# Patient Record
Sex: Female | Born: 1955 | Race: White | Hispanic: No | Marital: Married | Smoking: Never smoker
Health system: Southern US, Community
[De-identification: ages and names within clinical notes are randomized; demographics above are authoritative.]

## PROBLEM LIST (undated history)

## (undated) DIAGNOSIS — J45909 Unspecified asthma, uncomplicated: Secondary | ICD-10-CM

## (undated) DIAGNOSIS — K219 Gastro-esophageal reflux disease without esophagitis: Secondary | ICD-10-CM

## (undated) DIAGNOSIS — G4700 Insomnia, unspecified: Secondary | ICD-10-CM

## (undated) DIAGNOSIS — L509 Urticaria, unspecified: Secondary | ICD-10-CM

## (undated) DIAGNOSIS — M1990 Unspecified osteoarthritis, unspecified site: Secondary | ICD-10-CM

## (undated) DIAGNOSIS — R32 Unspecified urinary incontinence: Secondary | ICD-10-CM

## (undated) DIAGNOSIS — I10 Essential (primary) hypertension: Secondary | ICD-10-CM

## (undated) HISTORY — DX: Unspecified osteoarthritis, unspecified site: M19.90

## (undated) HISTORY — PX: OTHER SURGICAL HISTORY: SHX169

## (undated) HISTORY — PX: DIAGNOSTIC LAPAROSCOPY: SUR761

## (undated) HISTORY — DX: Essential (primary) hypertension: I10

## (undated) HISTORY — DX: Unspecified asthma, uncomplicated: J45.909

## (undated) HISTORY — DX: Insomnia, unspecified: G47.00

## (undated) HISTORY — DX: Unspecified urinary incontinence: R32

## (undated) HISTORY — DX: Urticaria, unspecified: L50.9

---

## 1993-06-26 DIAGNOSIS — G4700 Insomnia, unspecified: Secondary | ICD-10-CM

## 1993-06-26 HISTORY — DX: Insomnia, unspecified: G47.00

## 2002-09-02 ENCOUNTER — Ambulatory Visit (HOSPITAL_COMMUNITY): Admission: RE | Admit: 2002-09-02 | Discharge: 2002-09-02 | Payer: Self-pay | Admitting: Family Medicine

## 2002-09-02 ENCOUNTER — Encounter: Payer: Self-pay | Admitting: Family Medicine

## 2002-10-16 ENCOUNTER — Encounter: Admission: RE | Admit: 2002-10-16 | Discharge: 2002-10-16 | Payer: Self-pay | Admitting: Family Medicine

## 2002-10-16 ENCOUNTER — Encounter: Payer: Self-pay | Admitting: Family Medicine

## 2003-12-24 ENCOUNTER — Other Ambulatory Visit: Admission: RE | Admit: 2003-12-24 | Discharge: 2003-12-24 | Payer: Self-pay | Admitting: Family Medicine

## 2004-12-23 ENCOUNTER — Ambulatory Visit (HOSPITAL_COMMUNITY): Admission: RE | Admit: 2004-12-23 | Discharge: 2004-12-23 | Payer: Self-pay | Admitting: Family Medicine

## 2004-12-26 ENCOUNTER — Other Ambulatory Visit: Admission: RE | Admit: 2004-12-26 | Discharge: 2004-12-26 | Payer: Self-pay | Admitting: Family Medicine

## 2006-01-16 ENCOUNTER — Other Ambulatory Visit: Admission: RE | Admit: 2006-01-16 | Discharge: 2006-01-16 | Payer: Self-pay | Admitting: Family Medicine

## 2006-01-26 ENCOUNTER — Ambulatory Visit (HOSPITAL_COMMUNITY): Admission: RE | Admit: 2006-01-26 | Discharge: 2006-01-26 | Payer: Self-pay | Admitting: Family Medicine

## 2007-01-21 ENCOUNTER — Other Ambulatory Visit: Admission: RE | Admit: 2007-01-21 | Discharge: 2007-01-21 | Payer: Self-pay | Admitting: Family Medicine

## 2007-01-30 ENCOUNTER — Ambulatory Visit (HOSPITAL_COMMUNITY): Admission: RE | Admit: 2007-01-30 | Discharge: 2007-01-30 | Payer: Self-pay | Admitting: Family Medicine

## 2007-11-28 ENCOUNTER — Encounter: Admission: RE | Admit: 2007-11-28 | Discharge: 2007-11-28 | Payer: Self-pay | Admitting: Family Medicine

## 2007-12-26 ENCOUNTER — Ambulatory Visit (HOSPITAL_COMMUNITY): Admission: RE | Admit: 2007-12-26 | Discharge: 2007-12-26 | Payer: Self-pay | Admitting: Family Medicine

## 2008-01-09 ENCOUNTER — Ambulatory Visit (HOSPITAL_COMMUNITY): Admission: RE | Admit: 2008-01-09 | Discharge: 2008-01-09 | Payer: Self-pay | Admitting: Family Medicine

## 2008-01-30 ENCOUNTER — Other Ambulatory Visit: Admission: RE | Admit: 2008-01-30 | Discharge: 2008-01-30 | Payer: Self-pay | Admitting: Family Medicine

## 2008-12-29 ENCOUNTER — Ambulatory Visit (HOSPITAL_COMMUNITY): Admission: RE | Admit: 2008-12-29 | Discharge: 2008-12-29 | Payer: Self-pay | Admitting: Family Medicine

## 2009-02-05 ENCOUNTER — Other Ambulatory Visit: Admission: RE | Admit: 2009-02-05 | Discharge: 2009-02-05 | Payer: Self-pay | Admitting: Family Medicine

## 2009-12-31 ENCOUNTER — Ambulatory Visit (HOSPITAL_COMMUNITY): Admission: RE | Admit: 2009-12-31 | Discharge: 2009-12-31 | Payer: Self-pay | Admitting: Family Medicine

## 2010-03-25 ENCOUNTER — Other Ambulatory Visit: Admission: RE | Admit: 2010-03-25 | Discharge: 2010-03-25 | Payer: Self-pay | Admitting: Family Medicine

## 2010-05-18 ENCOUNTER — Ambulatory Visit (HOSPITAL_BASED_OUTPATIENT_CLINIC_OR_DEPARTMENT_OTHER): Admission: RE | Admit: 2010-05-18 | Discharge: 2010-05-18 | Payer: Self-pay | Admitting: Orthopedic Surgery

## 2010-09-06 LAB — POCT I-STAT 4, (NA,K, GLUC, HGB,HCT)
Glucose, Bld: 108 mg/dL — ABNORMAL HIGH (ref 70–99)
HCT: 43 % (ref 36.0–46.0)
Hemoglobin: 14.6 g/dL (ref 12.0–15.0)
Potassium: 4.3 mEq/L (ref 3.5–5.1)
Sodium: 141 mEq/L (ref 135–145)

## 2011-12-18 ENCOUNTER — Other Ambulatory Visit (HOSPITAL_COMMUNITY): Payer: Self-pay | Admitting: Family Medicine

## 2011-12-18 DIAGNOSIS — Z1231 Encounter for screening mammogram for malignant neoplasm of breast: Secondary | ICD-10-CM

## 2011-12-26 ENCOUNTER — Other Ambulatory Visit: Payer: Self-pay | Admitting: Family Medicine

## 2011-12-26 DIAGNOSIS — R413 Other amnesia: Secondary | ICD-10-CM

## 2012-01-09 ENCOUNTER — Ambulatory Visit (HOSPITAL_COMMUNITY)
Admission: RE | Admit: 2012-01-09 | Discharge: 2012-01-09 | Disposition: A | Discharge status: Home / Self Care | Payer: BC Managed Care – PPO | Source: Ambulatory Visit | Attending: Family Medicine | Admitting: Family Medicine

## 2012-01-09 DIAGNOSIS — Z1231 Encounter for screening mammogram for malignant neoplasm of breast: Secondary | ICD-10-CM | POA: Insufficient documentation

## 2012-07-16 ENCOUNTER — Encounter: Payer: Self-pay | Admitting: Obstetrics and Gynecology

## 2012-07-16 ENCOUNTER — Encounter: Payer: BC Managed Care – PPO | Admitting: Obstetrics and Gynecology

## 2012-07-16 ENCOUNTER — Ambulatory Visit: Payer: BC Managed Care – PPO

## 2012-07-16 ENCOUNTER — Ambulatory Visit: Payer: BC Managed Care – PPO | Admitting: Obstetrics and Gynecology

## 2012-07-16 VITALS — BP 110/70 | Ht 62.0 in | Wt 154.0 lb

## 2012-07-16 DIAGNOSIS — D219 Benign neoplasm of connective and other soft tissue, unspecified: Secondary | ICD-10-CM

## 2012-07-16 DIAGNOSIS — N819 Female genital prolapse, unspecified: Secondary | ICD-10-CM

## 2012-07-16 DIAGNOSIS — N898 Other specified noninflammatory disorders of vagina: Secondary | ICD-10-CM

## 2012-07-16 DIAGNOSIS — N952 Postmenopausal atrophic vaginitis: Secondary | ICD-10-CM

## 2012-07-16 DIAGNOSIS — B373 Candidiasis of vulva and vagina: Secondary | ICD-10-CM

## 2012-07-16 DIAGNOSIS — R32 Unspecified urinary incontinence: Secondary | ICD-10-CM

## 2012-07-16 LAB — POCT URINALYSIS DIPSTICK
Bilirubin, UA: NEGATIVE
Blood, UA: NEGATIVE
Glucose, UA: NEGATIVE
Ketones, UA: NEGATIVE
Nitrite, UA: NEGATIVE
Protein, UA: NEGATIVE
Spec Grav, UA: 1.01
Urobilinogen, UA: NEGATIVE
pH, UA: 6

## 2012-07-16 LAB — POCT WET PREP (WET MOUNT)
Whiff Test: NEGATIVE
pH: 4.5

## 2012-07-16 MED ORDER — FLUCONAZOLE 150 MG PO TABS: 150.0000 mg | ORAL_TABLET | Freq: Once | ORAL | Status: DC

## 2012-07-16 NOTE — Progress Notes (Signed)
56 YO G 7 P 3-0-4-3,  referred by Dr. Docia Chuck for management of pelvic prolapse symptoms.  For the past month patient reports urine dribbling episodes on a daily basis-without warning. She has also felt what she thought to be her bladder at her vaginal opening.  She has noticed that it "retreats" at bedtime but is very prevalent during the day.  In the past she was told she had a 9 cm fibroid but that it decreased after childbirth.  Denies nocturia, dysuria, hematuria or change in bowel function.  Admits to a moderate vaginal discharge that is white and sometimes irritating.  O: Neck: supple no masses or thyromegaly/adenopathy      Heart:  RRR       Lungs: clear to auscultation      Abdomen: soft without tenderness      Pelvic:  EGBUS-atrophic with vaginal mucosa visible at vaginal opening, vagina anterior-prolapsed 4/4 cystocele, cervix/uterus-mild     descensus with uterus appearing 8-10 weeks size without tenderness, adnexae-no masses or tenderness  U/A- pH-6.0,  SG-1.010, leukocytes 1+ urine for culture  Wet Prep:  ph-4.5, whiff-negative, few yeast  A: Pelvic prolapse     Urinary Incontinence     Yeast Vaginitis      H/O Fibroid      Vulvovaginal Atrophy  P:  Reviewed medical and surgical management options for pelvic prolapse        Diflucan 150 mg #1  1 po stat         Pelvic U/S to evaluate fibroid with F/U with Dr. Su Hilt ASAP          RTO-as scheduled  POWELL,ELMIRA, PA-C

## 2012-07-22 ENCOUNTER — Encounter: Payer: Self-pay | Admitting: Obstetrics and Gynecology

## 2012-07-23 ENCOUNTER — Encounter: Payer: BC Managed Care – PPO | Admitting: Obstetrics and Gynecology

## 2012-07-23 ENCOUNTER — Telehealth: Payer: Self-pay | Admitting: Obstetrics and Gynecology

## 2012-07-23 MED ORDER — TERCONAZOLE 0.4 % VA CREA: TOPICAL_CREAM | VAGINAL | Status: DC

## 2012-07-23 NOTE — Telephone Encounter (Signed)
Patient with bladder prolapse and recent yeast diagnosis may be given Terazol 7 Vaginal Cream #1 tube to use 1 appl. pv qhs x 7 days 1 refill. POWELL,ELMIRA, PA-C

## 2012-07-23 NOTE — Telephone Encounter (Signed)
EP

## 2012-07-23 NOTE — Telephone Encounter (Signed)
Tc to pt per EP recs. Pt voices understanding. Rx for [employer] 7 e-pres to pharm on file.

## 2012-07-23 NOTE — Telephone Encounter (Signed)
Lm on vm to cb per telephone call.  

## 2012-07-23 NOTE — Telephone Encounter (Signed)
ELMIRA,       CHANDRA SENT YOU A MESSAGE ON THIS PT . PLEASE READ AND ADVISE.

## 2012-08-01 ENCOUNTER — Telehealth: Payer: Self-pay | Admitting: Obstetrics and Gynecology

## 2012-08-01 MED ORDER — TERCONAZOLE 0.4 % VA CREA: 1.0000 | TOPICAL_CREAM | Freq: Every day | VAGINAL | Status: DC

## 2012-08-01 NOTE — Telephone Encounter (Signed)
S: TC to patient return, she voiced c/o of discharge.  She completed Diflucan as directed 2 weeks ago had relief briefly.  Symptoms returned, so       she completed the Terozole Cream on Monday, had temporary relief, but symptoms (wet and thick white creamy discharge, burning) returned       on Tuesday.  Denies hx of DM, no other changes in soaps/detergents.    A: Patient with continued yeast symptoms  P: Discussed with EP, PA-C and encouraged patient to try baking soda sitz bath 3-4 days if no improvements will give patient 1 refill on Terozole          Cream.  Instruction give to patient and pt voiced understanding.   Lynnell Jude, FNP-BC

## 2012-08-07 ENCOUNTER — Ambulatory Visit: Payer: BC Managed Care – PPO

## 2012-08-07 ENCOUNTER — Encounter: Payer: Self-pay | Admitting: Obstetrics and Gynecology

## 2012-08-07 ENCOUNTER — Ambulatory Visit: Payer: BC Managed Care – PPO | Admitting: Obstetrics and Gynecology

## 2012-08-07 ENCOUNTER — Other Ambulatory Visit: Payer: Self-pay | Admitting: Obstetrics and Gynecology

## 2012-08-07 VITALS — BP 140/70 | HR 80 | Resp 16 | Wt 155.0 lb

## 2012-08-07 DIAGNOSIS — D219 Benign neoplasm of connective and other soft tissue, unspecified: Secondary | ICD-10-CM

## 2012-08-07 DIAGNOSIS — N8189 Other female genital prolapse: Secondary | ICD-10-CM

## 2012-08-07 DIAGNOSIS — R32 Unspecified urinary incontinence: Secondary | ICD-10-CM | POA: Insufficient documentation

## 2012-08-07 NOTE — Progress Notes (Signed)
Here to f/u u/s secondary to h/o fibroids and c/o dribbling during the day but sleeps through night 8hrs at a time.  She has not had a cycle for 60yrs.  Has to tighten legs when coughing to avoid leaking.  She had vag delivery.  Filed Vitals:   08/07/12 1444  BP: 140/70  Pulse: 80  Resp: 16   ROS: noncontributory  Pelvic exam:  VULVA: normal appearing vulva with no masses, tenderness or lesions,  VAGINA: normal appearing vagina with normal color and discharge, no lesions, significant cystocele down to introitus with pushing, gradw 1-2 rectocele CERVIX: normal appearing cervix without discharge or lesions,  UTERUS: uterus is normal size, shape, consistency and nontender,  ADNEXA: normal adnexa in size, nontender and no masses. RECTAL: no masses  U/S - Ut  7.0x8.8x6.5cm, rt ov not seen, lt ovary wnl 1.7, EE 0.325cm, 4 fibroids (1.5.8cm, 2.1.8, 3.1.9, 4.2.7)  A/P Asymptomatic fibroids but pt wants them removed so is contemplating hysterectomy that she wants in April If has hysterectomy I also rec BSO (pt has h/o salpingectomy for ectopic) Pt def want A-P repair and considering TVT but is apprehensive about bladder testing and mesh Plan TLH, BSO (h/o unilateral salpingectomy), cystoscopy, A-P repair, poss TVT - (pt need bladder testing at her convenience prior to surgery) - pt will let Adrianne know what she does and does not want for sure UCx sent Pamphlets hysterectomy and TVT.  Pt has incontinence pamphlet already.

## 2012-08-09 ENCOUNTER — Other Ambulatory Visit: Payer: Self-pay

## 2012-08-09 DIAGNOSIS — R32 Unspecified urinary incontinence: Secondary | ICD-10-CM

## 2012-08-12 ENCOUNTER — Encounter: Payer: Self-pay | Admitting: Obstetrics and Gynecology

## 2012-08-12 ENCOUNTER — Other Ambulatory Visit: Payer: BC Managed Care – PPO

## 2012-08-12 DIAGNOSIS — R32 Unspecified urinary incontinence: Secondary | ICD-10-CM

## 2012-08-15 ENCOUNTER — Telehealth: Payer: Self-pay | Admitting: Obstetrics and Gynecology

## 2012-08-15 LAB — URINE CULTURE: Colony Count: >=100000

## 2012-08-15 NOTE — Telephone Encounter (Signed)
Spoke with pt rgd msg informed results in awaiting ar to sign off on results pt voice understanding

## 2012-08-16 ENCOUNTER — Telehealth: Payer: Self-pay | Admitting: Obstetrics and Gynecology

## 2012-08-16 ENCOUNTER — Other Ambulatory Visit: Payer: Self-pay

## 2012-08-16 ENCOUNTER — Other Ambulatory Visit: Payer: Self-pay | Admitting: Obstetrics and Gynecology

## 2012-08-16 MED ORDER — NITROFURANTOIN MONOHYD MACRO 100 MG PO CAPS: 100.0000 mg | ORAL_CAPSULE | Freq: Two times a day (BID) | ORAL | Status: AC

## 2012-08-16 NOTE — Telephone Encounter (Signed)
TLH; Cystoscopy A&P Repair,Poss. TVT scheduled for 10/02/11 @ 7:30 with [facility]. [employer] effective 06/26/12. Plan pays 80/20 after a $700 deductible. Pre-op due $640.74 - 1/2 $320.37 -ap

## 2012-08-16 NOTE — Telephone Encounter (Signed)
Pt notified of UTI. Rx for Macrobid e-RX'd to [facility], 1 bid x 7 days # 14 NO RF's. Melody Comas A

## 2012-08-16 NOTE — Telephone Encounter (Signed)
Ar pt 

## 2012-08-16 NOTE — Telephone Encounter (Signed)
Discussion with patient when scheduling her surgery indicated she did not want to have her ovaries out right now.  She has additional questions about the need for hormone therapy if she does.  Please discuss this during her pre-op 09/09/12. -ap

## 2012-08-23 ENCOUNTER — Other Ambulatory Visit: Payer: Self-pay

## 2012-08-23 ENCOUNTER — Ambulatory Visit: Payer: BC Managed Care – PPO | Admitting: Obstetrics and Gynecology

## 2012-08-23 ENCOUNTER — Encounter: Payer: Self-pay | Admitting: Obstetrics and Gynecology

## 2012-08-23 VITALS — BP 146/72 | Resp 16 | Wt 154.0 lb

## 2012-08-23 DIAGNOSIS — N390 Urinary tract infection, site not specified: Secondary | ICD-10-CM

## 2012-08-23 DIAGNOSIS — R32 Unspecified urinary incontinence: Secondary | ICD-10-CM

## 2012-08-23 LAB — POCT URINALYSIS DIPSTICK
Bilirubin, UA: NEGATIVE
Glucose, UA: NEGATIVE
Nitrite, UA: POSITIVE
Protein, UA: NEGATIVE
Spec Grav, UA: <=1.005
Urobilinogen, UA: NEGATIVE
pH, UA: 7

## 2012-08-23 LAB — POCT WET PREP (WET MOUNT)
Clue Cells Wet Prep Whiff POC: NEGATIVE
pH: 5

## 2012-08-23 NOTE — Progress Notes (Signed)
Here for Lumax and still c/o irritation "down there"  Filed Vitals:   08/23/12 1443  BP: 146/72  Resp: 16   ROS: noncontributory  Pelvic exam:  VULVA: normal appearing vulva with no masses, tenderness or lesions,  VAGINA: normal appearing vagina with normal color and discharge, no lesions, CERVIX: normal appearing cervix without discharge or lesions,  UTERUS: uterus is normal size, shape, consistency and nontender,  ADNEXA: normal adnexa in size, nontender and no masses.  A/P Wet prep - neg with elevated ph rec trial of rephresh Urine to Cx - if neg r/s lumax and if positive then treat and r/s lumax to after course of tx

## 2012-08-27 LAB — URINE CULTURE: Colony Count: 50000

## 2012-08-28 ENCOUNTER — Telehealth: Payer: Self-pay | Admitting: Obstetrics and Gynecology

## 2012-08-28 DIAGNOSIS — N390 Urinary tract infection, site not specified: Secondary | ICD-10-CM

## 2012-08-28 MED ORDER — LEVOFLOXACIN 250 MG PO TABS: ORAL_TABLET | ORAL | Status: DC

## 2012-08-28 NOTE — Telephone Encounter (Signed)
Megan [name] to address

## 2012-08-28 NOTE — Telephone Encounter (Signed)
Pt notified of UCX result. Per AR Levaquin 250 mg  1 po qd x 3 days # 3  NO RF's e-Rx'd to pt's pharmacy. TOC appt set for 09/06/2012. Pt needs to be cleared of infection prior to Lumax testing.  Melody Comas A

## 2012-09-06 ENCOUNTER — Other Ambulatory Visit: Payer: BC Managed Care – PPO

## 2012-09-06 DIAGNOSIS — N390 Urinary tract infection, site not specified: Secondary | ICD-10-CM

## 2012-09-08 LAB — URINE CULTURE
Colony Count: NO GROWTH
Organism ID, Bacteria: NO GROWTH

## 2012-09-09 ENCOUNTER — Encounter: Payer: BC Managed Care – PPO | Admitting: Obstetrics and Gynecology

## 2012-09-13 ENCOUNTER — Other Ambulatory Visit (HOSPITAL_COMMUNITY): Payer: Self-pay | Admitting: Obstetrics and Gynecology

## 2012-09-13 NOTE — H&P (Signed)
Megan [name] is a 57 y.o.  married female G7 P 3-0-4-3  presents for hysterectomy with anterior and posterior repair because of symptomatic uterine prolapse. The patient was referred by Dr. Docia Chuck with Megan [name] at [facility] a year ago for evaluation and management of uterine prolapse. At that time patient complained of dribbling urine involuntarily, having to cross her legs to cough to prevent urine leakage and copious amounts of white vaginal discharge that was otherwise asymptomatic. Over the course of the year the patient noticed that she could see and feel her bladder or cervix protruding from her vagina( though when she reclined it would "retreat"). She denies dysuria, hematuria, urinary frequency, dyspareunia , bowel changes, vaginal itching or odor.   A pelvic ultrasound in February of this year showed: uterus 8.81 x 6.56 x 7.06 cm with left ovary: 1.72 x 1.28 x 1.21 cm-her right ovary was not visible. She also was found to have #4 measurable fibroids: posterior subserosal with a calcified rim-5.2 x 5.1 x 5.8 cm, #2 left anterior intramural fibroids: 1.8 x 1.7 x 1.9 cm & 1.6 x 1.7 x 1.9 cm and a right fundal intramural fibroid: 2.7 x 2.6 x 2.5 cm A review of both medical and surgical management options were given to the patient however, she chooses to proceed with hysterectomy with anterior/posterior repair.   Past Medical History  OB History: G 7;  P 3-0-4-3; . SVD- 1989, 1991 and 1992  GYN History: menarche: 57 YO; LMP: menopausal; Contracepton menopausal  The patient denies history of sexually transmitted disease.  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   ROS: Admits to glasses, right hand paresthesias, bilateral hand arthritis, occasional indigestion/acid reflux, shortness of breath with asthma;  Denies headache, vision changes, nasal congestion, dysphagia, tinnitus, dizziness, hoarseness, cough,  chest pain, nausea, vomiting, diarrhea,constipation,  urinary frequency, dysuria, hematuria, vaginitis symptoms, pelvic pain, swelling of joints,easy bruising,  myalgias, skin rashes (except with urticaria flare), unexplained weight loss and except as is mentioned in the history of present illness, patient's review of systems is otherwise negative.  Physical Exam  BP 140/70, P 80 regular  Weight 146lbs  Height  5'2"  BMI  26.7  Temperature 98.8 degrees F (orally) Neck: supple without masses or thyromegaly Lungs: clear to auscultation Heart: regular rate and rhythm Abdomen: soft, non-tender and no organomegaly Pelvic:EGBUS-atrophic vagina-atrophic with cervix visible just inside of vaginal opening; uterus-8-10 week size with moderate descent, cervix without lesions or motion tenderness; adnexae-no tenderness or masses Extremities:  no clubbing, cyanosis or edema   Assesment:  Pelvic Prolapse                       Urinary Incontinence  Uterine Fibroids   Disposition:  A discussion was held with patient regarding the indication for her procedure(s) along with the risks, which include but are not limited to: reaction to anesthesia, damage to adjacent organs, infection, worsening  bladder symptoms, erosion of tension free mesh (if TVT performed)  and excessive bleeding. A Miralax Bowel Prep was given to the patient to be completed 24 hours before procedure.  The patient verbalized understanding of the risks and pre-operative instructions and has consented to proceed with a Total Laparoscopic Hysterectomy, Cystoscopy, Anterior/Posterior Repair, Bilateral Salpingectomy and possible Placement of Tension Free Vaginal Tape at [facility], October 02, 2011 at 7:30 a.m.   CSN# [id number]   Megan J. Lowell Guitar, PA-C  for Dr. Woodroe Mode. Su Hilt

## 2012-09-19 ENCOUNTER — Encounter (HOSPITAL_COMMUNITY): Payer: Self-pay | Admitting: Pharmacist

## 2012-09-23 ENCOUNTER — Encounter (HOSPITAL_COMMUNITY): Payer: Self-pay

## 2012-09-23 ENCOUNTER — Encounter (HOSPITAL_COMMUNITY)
Admission: RE | Admit: 2012-09-23 | Discharge: 2012-09-23 | Disposition: A | Discharge status: Home / Self Care | Payer: BC Managed Care – PPO | Source: Ambulatory Visit | Attending: Obstetrics and Gynecology | Admitting: Obstetrics and Gynecology

## 2012-09-23 DIAGNOSIS — Z01812 Encounter for preprocedural laboratory examination: Secondary | ICD-10-CM | POA: Insufficient documentation

## 2012-09-23 DIAGNOSIS — Z01818 Encounter for other preprocedural examination: Secondary | ICD-10-CM | POA: Insufficient documentation

## 2012-09-23 HISTORY — DX: Gastro-esophageal reflux disease without esophagitis: K21.9

## 2012-09-23 LAB — BASIC METABOLIC PANEL
BUN: 13 mg/dL (ref 6–23)
CO2: 29 mEq/L (ref 19–32)
Calcium: 9.9 mg/dL (ref 8.4–10.5)
Chloride: 99 mEq/L (ref 96–112)
Creatinine, Ser: 0.8 mg/dL (ref 0.50–1.10)
GFR calc Af Amer: >90 mL/min (ref >90)
GFR calc non Af Amer: 82 mL/min — ABNORMAL LOW (ref >90)
Glucose, Bld: 94 mg/dL (ref 70–99)
Potassium: 3.9 mEq/L (ref 3.5–5.1)
Sodium: 136 mEq/L (ref 135–145)

## 2012-09-23 LAB — CBC
HCT: 40.2 % (ref 36.0–46.0)
Hemoglobin: 13.7 g/dL (ref 12.0–15.0)
MCH: 29.5 pg (ref 26.0–34.0)
MCHC: 34.1 g/dL (ref 30.0–36.0)
MCV: 86.6 fL (ref 78.0–100.0)
Platelets: 337 10*3/uL (ref 150–400)
RBC: 4.64 MIL/uL (ref 3.87–5.11)
RDW: 12.6 % (ref 11.5–15.5)
WBC: 7.8 10*3/uL (ref 4.0–10.5)

## 2012-09-23 LAB — SURGICAL PCR SCREEN
MRSA, PCR: NEGATIVE
Staphylococcus aureus: POSITIVE — AB

## 2012-09-23 NOTE — Patient Instructions (Signed)
Your procedure is scheduled on:10/01/12  Enter through the Main Entrance at : 6am Pick up desk phone and dial [phone] and inform us of your arrival.  Please call [phone] if you have any problems the morning of surgery.  Remember: Do not eat or drink after midnight:Monday   Take these meds the morning of surgery with a sip of water:usual am meds  DO NOT wear jewelry, eye make-up, lipstick,body lotion, or dark fingernail polish. Do not shave for 48 hours prior to surgery.  If you are to be admitted after surgery, leave suitcase in car until your room has been assigned. Patients discharged on the day of surgery will not be allowed to drive home.

## 2012-09-24 NOTE — Pre-Procedure Instructions (Signed)
Copy of EKG sent via fax to Dr. Veverly Fells per pt's request.

## 2012-09-27 MED ORDER — OXYTOCIN 10 UNIT/ML IJ SOLN
INTRAMUSCULAR | Status: AC
  Filled 2012-09-27: qty 4

## 2012-09-27 MED ORDER — ONDANSETRON HCL 4 MG/2ML IJ SOLN
INTRAMUSCULAR | Status: AC
  Filled 2012-09-27: qty 2

## 2012-09-27 MED ORDER — SODIUM BICARBONATE 8.4 % IV SOLN
INTRAVENOUS | Status: AC
  Filled 2012-09-27: qty 50

## 2012-09-27 MED ORDER — MEPERIDINE HCL 25 MG/ML IJ SOLN
INTRAMUSCULAR | Status: AC
  Filled 2012-09-27: qty 1

## 2012-09-27 MED ORDER — LIDOCAINE-EPINEPHRINE (PF) 2 %-1:200000 IJ SOLN
INTRAMUSCULAR | Status: AC
  Filled 2012-09-27: qty 20

## 2012-09-27 MED ORDER — MORPHINE SULFATE 0.5 MG/ML IJ SOLN
INTRAMUSCULAR | Status: AC
  Filled 2012-09-27: qty 10

## 2012-10-16 ENCOUNTER — Other Ambulatory Visit: Payer: Self-pay | Admitting: Obstetrics and Gynecology

## 2012-10-28 ENCOUNTER — Encounter (HOSPITAL_COMMUNITY)
Admission: RE | Admit: 2012-10-28 | Discharge: 2012-10-28 | Disposition: A | Discharge status: Home / Self Care | Payer: BC Managed Care – PPO | Source: Ambulatory Visit | Attending: Obstetrics and Gynecology | Admitting: Obstetrics and Gynecology

## 2012-10-28 ENCOUNTER — Encounter (HOSPITAL_COMMUNITY): Payer: Self-pay

## 2012-10-28 DIAGNOSIS — Z01818 Encounter for other preprocedural examination: Secondary | ICD-10-CM | POA: Insufficient documentation

## 2012-10-28 DIAGNOSIS — Z01812 Encounter for preprocedural laboratory examination: Secondary | ICD-10-CM | POA: Insufficient documentation

## 2012-10-28 LAB — BASIC METABOLIC PANEL
BUN: 15 mg/dL (ref 6–23)
CO2: 30 mEq/L (ref 19–32)
Calcium: 9.6 mg/dL (ref 8.4–10.5)
Chloride: 99 mEq/L (ref 96–112)
Creatinine, Ser: 0.73 mg/dL (ref 0.50–1.10)
GFR calc Af Amer: >90 mL/min (ref >90)
GFR calc non Af Amer: >90 mL/min (ref >90)
Glucose, Bld: 148 mg/dL — ABNORMAL HIGH (ref 70–99)
Potassium: 3.6 mEq/L (ref 3.5–5.1)
Sodium: 138 mEq/L (ref 135–145)

## 2012-10-28 LAB — CBC
HCT: 38.9 % (ref 36.0–46.0)
Hemoglobin: 13.2 g/dL (ref 12.0–15.0)
MCH: 29.6 pg (ref 26.0–34.0)
MCHC: 33.9 g/dL (ref 30.0–36.0)
MCV: 87.2 fL (ref 78.0–100.0)
Platelets: 323 10*3/uL (ref 150–400)
RBC: 4.46 MIL/uL (ref 3.87–5.11)
RDW: 12.8 % (ref 11.5–15.5)
WBC: 6.4 10*3/uL (ref 4.0–10.5)

## 2012-10-28 LAB — SURGICAL PCR SCREEN
MRSA, PCR: NEGATIVE
Staphylococcus aureus: NEGATIVE

## 2012-10-28 NOTE — Patient Instructions (Addendum)
Your procedure is scheduled on:11/06/12  Enter through the Main Entrance at :1045 am Pick up desk phone and dial [phone] and inform us of your arrival.  Please call [phone] if you have any problems the morning of surgery.  Remember: Do not eat after midnight: Tuesday Clear liquids ok until 8am on WED   Take these meds the morning of surgery with a sip of water:blood pressure meds, allergy pill if needed  DO NOT wear jewelry, eye make-up, lipstick,body lotion, or dark fingernail polish.   If you are to be admitted after surgery, leave suitcase in car until your room has been assigned. Patients discharged on the day of surgery will not be allowed to drive home.

## 2012-11-05 MED ORDER — DEXTROSE 5 % IV SOLN
2.0000 g | INTRAVENOUS | Status: AC
  Administered 2012-11-06: 2 g via INTRAVENOUS
  Filled 2012-11-05: qty 2

## 2012-11-05 MED ORDER — DEXTROSE 5 % IV SOLN
2.0000 g | INTRAVENOUS | Status: DC
  Filled 2012-11-05: qty 2

## 2012-11-06 ENCOUNTER — Ambulatory Visit (HOSPITAL_COMMUNITY): Payer: BC Managed Care – PPO | Admitting: Anesthesiology

## 2012-11-06 ENCOUNTER — Encounter (HOSPITAL_COMMUNITY)
Admission: RE | Disposition: A | Discharge status: Home / Self Care | Payer: Self-pay | Source: Ambulatory Visit | Attending: Obstetrics and Gynecology

## 2012-11-06 ENCOUNTER — Encounter (HOSPITAL_COMMUNITY): Payer: Self-pay | Admitting: Anesthesiology

## 2012-11-06 ENCOUNTER — Ambulatory Visit (HOSPITAL_COMMUNITY)
Admission: RE | Admit: 2012-11-06 | Discharge: 2012-11-07 | Disposition: A | Discharge status: Home / Self Care | Payer: BC Managed Care – PPO | Source: Ambulatory Visit | Attending: Obstetrics and Gynecology | Admitting: Obstetrics and Gynecology

## 2012-11-06 DIAGNOSIS — N840 Polyp of corpus uteri: Secondary | ICD-10-CM | POA: Insufficient documentation

## 2012-11-06 DIAGNOSIS — N393 Stress incontinence (female) (male): Secondary | ICD-10-CM | POA: Insufficient documentation

## 2012-11-06 DIAGNOSIS — D219 Benign neoplasm of connective and other soft tissue, unspecified: Secondary | ICD-10-CM

## 2012-11-06 DIAGNOSIS — N812 Incomplete uterovaginal prolapse: Principal | ICD-10-CM | POA: Insufficient documentation

## 2012-11-06 DIAGNOSIS — D251 Intramural leiomyoma of uterus: Secondary | ICD-10-CM | POA: Insufficient documentation

## 2012-11-06 DIAGNOSIS — N815 Vaginal enterocele: Secondary | ICD-10-CM | POA: Insufficient documentation

## 2012-11-06 DIAGNOSIS — N8189 Other female genital prolapse: Secondary | ICD-10-CM

## 2012-11-06 DIAGNOSIS — R32 Unspecified urinary incontinence: Secondary | ICD-10-CM

## 2012-11-06 HISTORY — PX: CYSTOSCOPY: SHX5120

## 2012-11-06 HISTORY — PX: BLADDER SUSPENSION: SHX72

## 2012-11-06 HISTORY — PX: UNILATERAL SALPINGECTOMY: SHX6160

## 2012-11-06 HISTORY — PX: ANTERIOR AND POSTERIOR REPAIR: SHX5121

## 2012-11-06 HISTORY — PX: LAPAROSCOPIC HYSTERECTOMY: SHX1926

## 2012-11-06 SURGERY — HYSTERECTOMY, TOTAL, LAPAROSCOPIC
Anesthesia: General | Site: Bladder | Laterality: Right | Wound class: Clean Contaminated

## 2012-11-06 MED ORDER — METOPROLOL TARTRATE 50 MG PO TABS
50.0000 mg | ORAL_TABLET | Freq: Two times a day (BID) | ORAL | Status: DC
Start: 2012-11-06 — End: 2012-11-07
  Administered 2012-11-06 – 2012-11-07 (×2): 50 mg via ORAL
  Filled 2012-11-06 (×4): qty 1

## 2012-11-06 MED ORDER — HYDROMORPHONE HCL PF 1 MG/ML IJ SOLN
INTRAMUSCULAR | Status: AC
  Filled 2012-11-06: qty 1

## 2012-11-06 MED ORDER — MEPERIDINE HCL 25 MG/ML IJ SOLN: 6.2500 mg | INTRAMUSCULAR | Status: DC | PRN

## 2012-11-06 MED ORDER — LACTATED RINGERS IR SOLN
Status: DC | PRN
  Administered 2012-11-06: 3000 mL

## 2012-11-06 MED ORDER — PROPOFOL INFUSION 10 MG/ML OPTIME
INTRAVENOUS | Status: DC | PRN
  Administered 2012-11-06: 200 mL via INTRAVENOUS

## 2012-11-06 MED ORDER — LORATADINE 10 MG PO TABS
10.0000 mg | ORAL_TABLET | Freq: Every day | ORAL | Status: DC
  Filled 2012-11-06: qty 1

## 2012-11-06 MED ORDER — DIPHENHYDRAMINE HCL 50 MG/ML IJ SOLN: 12.5000 mg | Freq: Four times a day (QID) | INTRAMUSCULAR | Status: DC | PRN

## 2012-11-06 MED ORDER — LACTATED RINGERS IV SOLN
INTRAVENOUS | Status: DC
  Administered 2012-11-06 – 2012-11-07 (×2): via INTRAVENOUS

## 2012-11-06 MED ORDER — BUPIVACAINE HCL (PF) 0.25 % IJ SOLN
INTRAMUSCULAR | Status: AC
  Filled 2012-11-06: qty 30

## 2012-11-06 MED ORDER — SCOPOLAMINE 1 MG/3DAYS TD PT72: 1.0000 | MEDICATED_PATCH | TRANSDERMAL | Status: DC

## 2012-11-06 MED ORDER — DIPHENHYDRAMINE HCL 12.5 MG/5ML PO ELIX: 12.5000 mg | ORAL_SOLUTION | Freq: Four times a day (QID) | ORAL | Status: DC | PRN

## 2012-11-06 MED ORDER — ESTRADIOL 0.1 MG/GM VA CREA
TOPICAL_CREAM | VAGINAL | Status: DC | PRN
  Administered 2012-11-06: 1 via VAGINAL

## 2012-11-06 MED ORDER — OXYCODONE-ACETAMINOPHEN 5-325 MG PO TABS: 1.0000 | ORAL_TABLET | ORAL | Status: DC | PRN

## 2012-11-06 MED ORDER — SCOPOLAMINE 1 MG/3DAYS TD PT72
MEDICATED_PATCH | TRANSDERMAL | Status: AC
  Administered 2012-11-06: 1.5 mg via TRANSDERMAL
  Filled 2012-11-06: qty 1

## 2012-11-06 MED ORDER — BUPIVACAINE HCL (PF) 0.25 % IJ SOLN
INTRAMUSCULAR | Status: DC | PRN
  Administered 2012-11-06: 25 mL

## 2012-11-06 MED ORDER — ONDANSETRON HCL 4 MG/2ML IJ SOLN
INTRAMUSCULAR | Status: DC | PRN
  Administered 2012-11-06: 4 mg via INTRAVENOUS

## 2012-11-06 MED ORDER — FENTANYL CITRATE 0.05 MG/ML IJ SOLN
INTRAMUSCULAR | Status: DC | PRN
  Administered 2012-11-06: 150 ug via INTRAVENOUS
  Administered 2012-11-06: 100 ug via INTRAVENOUS

## 2012-11-06 MED ORDER — IBUPROFEN 600 MG PO TABS: 600.0000 mg | ORAL_TABLET | Freq: Four times a day (QID) | ORAL | Status: DC | PRN

## 2012-11-06 MED ORDER — HYDROMORPHONE HCL PF 1 MG/ML IJ SOLN
INTRAMUSCULAR | Status: DC | PRN
  Administered 2012-11-06: 1 mg via INTRAVENOUS

## 2012-11-06 MED ORDER — MIDAZOLAM HCL 2 MG/2ML IJ SOLN
INTRAMUSCULAR | Status: AC
  Filled 2012-11-06: qty 2

## 2012-11-06 MED ORDER — SODIUM CHLORIDE 0.9 % IJ SOLN: 9.0000 mL | INTRAMUSCULAR | Status: DC | PRN

## 2012-11-06 MED ORDER — METOPROLOL TARTRATE 25 MG PO TABS
25.0000 mg | ORAL_TABLET | Freq: Two times a day (BID) | ORAL | Status: DC
  Filled 2012-11-06 (×2): qty 1

## 2012-11-06 MED ORDER — KETOROLAC TROMETHAMINE 30 MG/ML IJ SOLN: 30.0000 mg | Freq: Four times a day (QID) | INTRAMUSCULAR | Status: DC

## 2012-11-06 MED ORDER — ROCURONIUM BROMIDE 100 MG/10ML IV SOLN
INTRAVENOUS | Status: DC | PRN
  Administered 2012-11-06: 50 mg via INTRAVENOUS
  Administered 2012-11-06: 20 mg via INTRAVENOUS
  Administered 2012-11-06: 10 mg via INTRAVENOUS

## 2012-11-06 MED ORDER — KETOROLAC TROMETHAMINE 30 MG/ML IJ SOLN
INTRAMUSCULAR | Status: DC | PRN
  Administered 2012-11-06: 30 mg via INTRAVENOUS

## 2012-11-06 MED ORDER — VASOPRESSIN 20 UNIT/ML IJ SOLN
INTRAVENOUS | Status: DC | PRN
  Administered 2012-11-06: 14:00:00 via INTRAMUSCULAR

## 2012-11-06 MED ORDER — INDIGOTINDISULFONATE SODIUM 8 MG/ML IJ SOLN
INTRAMUSCULAR | Status: AC
  Filled 2012-11-06: qty 5

## 2012-11-06 MED ORDER — LACTATED RINGERS IV SOLN
INTRAVENOUS | Status: DC
  Administered 2012-11-06 (×4): via INTRAVENOUS
  Administered 2012-11-06: 1000 mL via INTRAVENOUS

## 2012-11-06 MED ORDER — PHENYLEPHRINE 40 MCG/ML (10ML) SYRINGE FOR IV PUSH (FOR BLOOD PRESSURE SUPPORT)
PREFILLED_SYRINGE | INTRAVENOUS | Status: AC
  Filled 2012-11-06: qty 5

## 2012-11-06 MED ORDER — MIDAZOLAM HCL 5 MG/5ML IJ SOLN
INTRAMUSCULAR | Status: DC | PRN
  Administered 2012-11-06: 2 mg via INTRAVENOUS

## 2012-11-06 MED ORDER — ESTRADIOL 0.1 MG/GM VA CREA
TOPICAL_CREAM | VAGINAL | Status: AC
  Filled 2012-11-06: qty 42.5

## 2012-11-06 MED ORDER — KETOROLAC TROMETHAMINE 30 MG/ML IJ SOLN
INTRAMUSCULAR | Status: AC
  Filled 2012-11-06: qty 1

## 2012-11-06 MED ORDER — DEXAMETHASONE SODIUM PHOSPHATE 4 MG/ML IJ SOLN
INTRAMUSCULAR | Status: DC | PRN
  Administered 2012-11-06: 8 mg via INTRAVENOUS

## 2012-11-06 MED ORDER — HYDROMORPHONE HCL PF 1 MG/ML IJ SOLN: 0.2500 mg | INTRAMUSCULAR | Status: DC | PRN

## 2012-11-06 MED ORDER — VASOPRESSIN 20 UNIT/ML IJ SOLN
INTRAMUSCULAR | Status: AC
  Filled 2012-11-06: qty 1

## 2012-11-06 MED ORDER — GLYCOPYRROLATE 0.2 MG/ML IJ SOLN
INTRAMUSCULAR | Status: AC
  Filled 2012-11-06: qty 1

## 2012-11-06 MED ORDER — BUPIVACAINE HCL (PF) 0.25 % IJ SOLN
INTRAMUSCULAR | Status: AC
  Filled 2012-11-06: qty 60

## 2012-11-06 MED ORDER — LIDOCAINE HCL (CARDIAC) 20 MG/ML IV SOLN
INTRAVENOUS | Status: DC | PRN
  Administered 2012-11-06: 50 mg via INTRAVENOUS

## 2012-11-06 MED ORDER — ONDANSETRON HCL 4 MG/2ML IJ SOLN: 4.0000 mg | Freq: Four times a day (QID) | INTRAMUSCULAR | Status: DC | PRN

## 2012-11-06 MED ORDER — GLYCOPYRROLATE 0.2 MG/ML IJ SOLN
INTRAMUSCULAR | Status: DC | PRN
  Administered 2012-11-06 (×2): 0.2 mg via INTRAVENOUS

## 2012-11-06 MED ORDER — METOPROLOL TARTRATE 50 MG PO TABS
50.0000 mg | ORAL_TABLET | Freq: Two times a day (BID) | ORAL | Status: DC
  Filled 2012-11-06 (×2): qty 1

## 2012-11-06 MED ORDER — HYDROMORPHONE 0.3 MG/ML IV SOLN
INTRAVENOUS | Status: DC
  Administered 2012-11-06: 23:00:00 via INTRAVENOUS
  Administered 2012-11-07: 1.33 mg via INTRAVENOUS
  Administered 2012-11-07: 0.2 mg via INTRAVENOUS
  Filled 2012-11-06: qty 25

## 2012-11-06 MED ORDER — PHENYLEPHRINE HCL 10 MG/ML IJ SOLN
INTRAMUSCULAR | Status: DC | PRN
  Administered 2012-11-06: .04 mg via INTRAVENOUS
  Administered 2012-11-06 (×2): .08 mg via INTRAVENOUS

## 2012-11-06 MED ORDER — AMITRIPTYLINE HCL 25 MG PO TABS
150.0000 mg | ORAL_TABLET | Freq: Every evening | ORAL | Status: DC | PRN
  Administered 2012-11-07: 150 mg via ORAL
  Filled 2012-11-06: qty 6

## 2012-11-06 MED ORDER — FAMOTIDINE 20 MG PO TABS: 20.0000 mg | ORAL_TABLET | Freq: Every day | ORAL | Status: DC | PRN

## 2012-11-06 MED ORDER — METOCLOPRAMIDE HCL 5 MG/ML IJ SOLN: 10.0000 mg | Freq: Once | INTRAMUSCULAR | Status: DC | PRN

## 2012-11-06 MED ORDER — LEVOCETIRIZINE DIHYDROCHLORIDE 5 MG PO TABS: 5.0000 mg | ORAL_TABLET | Freq: Every evening | ORAL | Status: DC

## 2012-11-06 MED ORDER — NALOXONE HCL 0.4 MG/ML IJ SOLN: 0.4000 mg | INTRAMUSCULAR | Status: DC | PRN

## 2012-11-06 MED ORDER — NEOSTIGMINE METHYLSULFATE 1 MG/ML IJ SOLN
INTRAMUSCULAR | Status: AC
  Filled 2012-11-06: qty 1

## 2012-11-06 MED ORDER — INDIGOTINDISULFONATE SODIUM 8 MG/ML IJ SOLN
INTRAMUSCULAR | Status: DC | PRN
  Administered 2012-11-06 (×2): 5 mL via INTRAVENOUS

## 2012-11-06 MED ORDER — FENTANYL CITRATE 0.05 MG/ML IJ SOLN
INTRAMUSCULAR | Status: AC
  Filled 2012-11-06: qty 5

## 2012-11-06 MED ORDER — NEOSTIGMINE METHYLSULFATE 1 MG/ML IJ SOLN
INTRAMUSCULAR | Status: DC | PRN
  Administered 2012-11-06: 1 mg via INTRAVENOUS

## 2012-11-06 SURGICAL SUPPLY — 74 items
BLADE SURG 10 STRL SS (BLADE) ×5 IMPLANT
BLADE SURG 11 STRL SS (BLADE) ×5 IMPLANT
BLADE SURG 15 STRL LF C SS BP (BLADE) ×4 IMPLANT
BLADE SURG 15 STRL SS (BLADE) ×1
BLADE SURG CLIPPER 3M 9600 (MISCELLANEOUS) ×5 IMPLANT
CANISTER SUCTION 2500CC (MISCELLANEOUS) ×10 IMPLANT
CATH FOLEY 2WAY SLVR  5CC 18FR (CATHETERS) ×3
CATH FOLEY 2WAY SLVR 5CC 18FR (CATHETERS) ×12 IMPLANT
CHLORAPREP W/TINT 26ML (MISCELLANEOUS) ×5 IMPLANT
CLOTH BEACON ORANGE TIMEOUT ST (SAFETY) ×5 IMPLANT
CONT PATH 16OZ SNAP LID 3702 (MISCELLANEOUS) ×5 IMPLANT
COVER MAYO STAND STRL (DRAPES) ×5 IMPLANT
DECANTER SPIKE VIAL GLASS SM (MISCELLANEOUS) ×10 IMPLANT
DERMABOND ADVANCED (GAUZE/BANDAGES/DRESSINGS) ×2
DERMABOND ADVANCED .7 DNX12 (GAUZE/BANDAGES/DRESSINGS) ×8 IMPLANT
DRAPE HYSTEROSCOPY (DRAPE) ×5 IMPLANT
DRAPE PROXIMA HALF (DRAPES) ×5 IMPLANT
DRAPE STERI URO 9X17 APER PCH (DRAPES) ×5 IMPLANT
ELECT REM PT RETURN 9FT ADLT (ELECTROSURGICAL) ×5
ELECTRODE REM PT RTRN 9FT ADLT (ELECTROSURGICAL) ×4 IMPLANT
EVACUATOR SMOKE 8.L (FILTER) ×10 IMPLANT
GAUZE PACKING 2X5 YD STERILE (GAUZE/BANDAGES/DRESSINGS) ×5 IMPLANT
GAUZE SPONGE 4X4 16PLY XRAY LF (GAUZE/BANDAGES/DRESSINGS) ×15 IMPLANT
GLOVE BIO SURGEON STRL SZ 6.5 (GLOVE) ×25 IMPLANT
GLOVE BIO SURGEON STRL SZ7.5 (GLOVE) ×25 IMPLANT
GLOVE BIOGEL PI IND STRL 7.0 (GLOVE) ×44 IMPLANT
GLOVE BIOGEL PI IND STRL 7.5 (GLOVE) ×20 IMPLANT
GLOVE BIOGEL PI INDICATOR 7.0 (GLOVE) ×11
GLOVE BIOGEL PI INDICATOR 7.5 (GLOVE) ×5
GLOVE NEODERM STER SZ 7 (GLOVE) ×20 IMPLANT
GLOVE SURG SS PI 7.0 STRL IVOR (GLOVE) ×35 IMPLANT
GOWN PREVENTION PLUS LG XLONG (DISPOSABLE) ×20 IMPLANT
GOWN STRL REIN XL XLG (GOWN DISPOSABLE) ×25 IMPLANT
NEEDLE HYPO 22GX1.5 SAFETY (NEEDLE) ×10 IMPLANT
NEEDLE INSUFFLATION 120MM (ENDOMECHANICALS) ×5 IMPLANT
NEEDLE SPNL 22GX3.5 QUINCKE BK (NEEDLE) ×10 IMPLANT
NS IRRIG 1000ML POUR BTL (IV SOLUTION) ×10 IMPLANT
OCCLUDER COLPOPNEUMO (BALLOONS) ×5 IMPLANT
PACK LAPAROSCOPY BASIN (CUSTOM PROCEDURE TRAY) ×5 IMPLANT
PACK VAGINAL WOMENS (CUSTOM PROCEDURE TRAY) ×5 IMPLANT
PAD OB MATERNITY 4.3X12.25 (PERSONAL CARE ITEMS) ×5 IMPLANT
PROTECTOR NERVE ULNAR (MISCELLANEOUS) ×10 IMPLANT
SCALPEL HARMONIC ACE (MISCELLANEOUS) ×5 IMPLANT
SET CYSTO W/LG BORE CLAMP LF (SET/KITS/TRAYS/PACK) ×5 IMPLANT
SET IRRIG TUBING LAPAROSCOPIC (IRRIGATION / IRRIGATOR) ×5 IMPLANT
SLING TRANS VAGINAL TAPE (Sling) ×1 IMPLANT
SLING UTERINE/ABD GYNECARE TVT (Sling) ×4 IMPLANT
SOLUTION ELECTROLUBE (MISCELLANEOUS) ×5 IMPLANT
SUT MNCRL AB 3-0 PS2 27 (SUTURE) ×20 IMPLANT
SUT MNCRL AB 4-0 PS2 18 (SUTURE) ×10 IMPLANT
SUT PDS AB 1 CT1 36 (SUTURE) ×30 IMPLANT
SUT VIC AB 0 CT1 27 (SUTURE) ×1
SUT VIC AB 0 CT1 27XBRD ANBCTR (SUTURE) ×4 IMPLANT
SUT VIC AB 0 CT1 36 (SUTURE) ×5 IMPLANT
SUT VIC AB 2-0 CT1 27 (SUTURE) ×1
SUT VIC AB 2-0 CT1 TAPERPNT 27 (SUTURE) ×4 IMPLANT
SUT VIC AB 2-0 SH 27 (SUTURE) ×8
SUT VIC AB 2-0 SH 27XBRD (SUTURE) ×32 IMPLANT
SUT VIC AB 3-0 SH 27 (SUTURE) ×3
SUT VIC AB 3-0 SH 27X BRD (SUTURE) ×12 IMPLANT
SUT VICRYL 0 UR6 27IN ABS (SUTURE) ×10 IMPLANT
SYR 50ML LL SCALE MARK (SYRINGE) ×5 IMPLANT
SYR TB 1ML 25GX5/8 (SYRINGE) ×5 IMPLANT
TAPE CLOTH SURG 4X10 WHT LF (GAUZE/BANDAGES/DRESSINGS) ×5 IMPLANT
TIP UTERINE 6.7X10CM GRN DISP (MISCELLANEOUS) ×5 IMPLANT
TOWEL OR 17X24 6PK STRL BLUE (TOWEL DISPOSABLE) ×20 IMPLANT
TRAY FOLEY CATH 14FR (SET/KITS/TRAYS/PACK) ×10 IMPLANT
TROCAR XCEL DIL TIP R 11M (ENDOMECHANICALS) ×5 IMPLANT
TROCAR XCEL NON-BLD 5MMX100MML (ENDOMECHANICALS) ×10 IMPLANT
TROCAR XCEL OPT SLVE 5M 100M (ENDOMECHANICALS) ×15 IMPLANT
TROCAR Z-THREAD FIOS 11X100 BL (TROCAR) ×10 IMPLANT
TUBING FILTER THERMOFLATOR (ELECTROSURGICAL) ×5 IMPLANT
WARMER LAPAROSCOPE (MISCELLANEOUS) ×5 IMPLANT
WATER STERILE IRR 1000ML POUR (IV SOLUTION) ×5 IMPLANT

## 2012-11-06 NOTE — H&P (Signed)
Megan [name] is a 57 y.o. married female G7 P 3-0-4-3 presents for hysterectomy with anterior and posterior repair because of symptomatic uterine prolapse. The patient was referred by Dr. Docia Chuck with [facility] at [facility] a year ago for evaluation and management of uterine prolapse. At that time patient complained of dribbling urine involuntarily, having to cross her legs to cough to prevent urine leakage and copious amounts of white vaginal discharge that was otherwise asymptomatic. Over the course of the year the patient noticed that she could see and feel her bladder or cervix protruding from her vagina( though when she reclined it would "retreat"). She denies dysuria, hematuria, urinary frequency, dyspareunia , bowel changes, vaginal itching or odor.  A pelvic ultrasound in February of this year showed: uterus 8.81 x 6.56 x 7.06 cm with left ovary: 1.72 x 1.28 x 1.21 cm-her right ovary was not visible. She also was found to have #4 measurable fibroids: posterior subserosal with a calcified rim-5.2 x 5.1 x 5.8 cm, #2 left anterior intramural fibroids: 1.8 x 1.7 x 1.9 cm & 1.6 x 1.7 x 1.9 cm and a right fundal intramural fibroid: 2.7 x 2.6 x 2.5 cm A review of both medical and surgical management options were given to the patient however, she chooses to proceed with hysterectomy with anterior/posterior repair.  Past Medical History  OB History: G 7; P 3-0-4-3; . SVD- 1989, 1991 and 1992  GYN History: menarche: 57 YO; LMP: menopausal; Contracepton menopausal The patient denies history of sexually transmitted disease. 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  ROS: Admits to glasses, right hand paresthesias, bilateral hand arthritis, occasional indigestion/acid reflux, shortness of breath with asthma; Denies headache, vision changes, nasal congestion, dysphagia, tinnitus, dizziness, hoarseness, cough, chest pain, nausea, vomiting, diarrhea,constipation, urinary frequency, dysuria, hematuria, vaginitis symptoms, pelvic pain, swelling of joints,easy bruising, myalgias, skin rashes (except with urticaria flare), unexplained weight loss and except as is mentioned in the history of present illness, patient's review of systems is otherwise negative.  Physical Exam  BP 140/70, P 80 regular Weight 146lbs Height 5'2" BMI 26.7 Temperature 98.8 degrees F (orally)  Neck: supple without masses or thyromegaly  Lungs: clear to auscultation  Heart: regular rate and rhythm  Abdomen: soft, non-tender and no organomegaly  Pelvic:EGBUS-atrophic vagina-atrophic with cervix visible just inside of vaginal opening; uterus-8-10 week size with moderate descent, cervix without lesions or motion tenderness; adnexae-no tenderness or masses  Extremities: no clubbing, cyanosis or edema  Assesment: Pelvic Prolapse  Urinary Incontinence  Uterine Fibroids  Disposition: A discussion was held with patient regarding the indication for her procedure(s) along with the risks, which include but are not limited to: reaction to anesthesia, damage to adjacent organs, infection, worsening bladder symptoms, erosion of tension free mesh (if TVT  performed) and  excessive bleeding. A Miralax Bowel Prep was given to the patient to be completed 24 hours before procedure. The patient verbalized understanding of the risks and pre-operative instructions and has consented to proceed with a Total Laparoscopic Hysterectomy, Cystoscopy, Anterior/Posterior Repair, Bilateral Salpingectomy and possible Placement of Tension Free Vaginal Tape at [facility] [place], October 02, 2011 at 7:30 a.m.  CSN# [id number]  [place] J. Lowell Guitar, PA-C for Dr. Woodroe Mode. Su Hilt   Agree with above will also proceed with bilateral salpingectomy as per my discussion with the patient and TVT.  Questions answered and consent signed and witnessed.  R/B/A discussed at length.

## 2012-11-06 NOTE — Transfer of Care (Signed)
Immediate Anesthesia Transfer of Care Note  Patient: Megan [name]  Procedure(s) Performed: Procedure(s): HYSTERECTOMY TOTAL LAPAROSCOPIC (N/A) ANTERIOR (CYSTOCELE) AND POSTERIOR REPAIR (RECTOCELE) (N/A) TRANSVAGINAL TAPE (TVT) PROCEDURE (N/A) UNILATERAL SALPINGECTOMY (Right) CYSTOSCOPY (N/A)  Patient Location: PACU  Anesthesia Type:General  Level of Consciousness: awake  Airway & Oxygen Therapy: Patient Spontanous Breathing  Post-op Assessment: Report given to PACU RN  Post vital signs: stable  Filed Vitals:   11/06/12 1051  BP: 111/92  Pulse: 83  Temp: 36.9 C  Resp: 20    Complications: No apparent anesthesia complications

## 2012-11-06 NOTE — Op Note (Addendum)
Preop Diagnosis: Symptomatic Fibroids, Unrinary Incontenence, Pelvic Relaxation - 58570, 57260, [id number], 52000   Postop Diagnosis: Symptomatic Fibroids, Unrinary Incontenence, Pelvic Relaxation - 58570, 57260, 57288, 52000   Procedure: HYSTERECTOMY TOTAL LAPAROSCOPIC LEFT SALPINGECTOMY ANTERIOR (CYSTOCELE)  POSTERIOR REPAIR (RECTOCELE) ENTEROCELE REPAIR TRANSVAGINAL TAPE (TVT) PROCEDURE (TENSION FREE VAGINAL TAPE) CYSTOSCOPY   Anesthesia: General   Attending: Purcell Nails, MD   Assistant: Jaymes Graff, MD  Findings: Uterus with multiple fibroids, normal bilateral ovaries  Pathology: uterus and cervix (342g)  Fluids: 3500 cc  UOP: 600 cc  EBL: 350 cc  Complications: None  Procedure: The patient was taken to the operating room, placed under general anesthesia and prepped and draped in the normal sterile fashion. A Foley catheter was placed in the bladder. The uterus sounded to 12 cm. A weighted speculum and vaginal retractors were placed in the vagina. Tenaculum was placed on the anterior lip of the cervix.  A size 10 cm tip was used and the rumi was placed, tip balloon and occluder insufflated. Attention was then turned to the abdomen. A 10 mm infraumbilical incision was made with the scalpel after 5 cc of 25% percent Marcaine was used for local anesthesia.  A Veress Needle was introduced into the intra-abdominal cavity and pneumoperitoneum was achieved.  Intraabdominal placement was confirmed with the laparoscope.  Two 5 mm trochars were placed in the right and left lower quadrants under direct visualization with the laparoscope.  The harmonic scalpel was used to cauterize and cut the uterine ovarian ligaments bilaterally.   Both round ligaments were cauterized and cut with the harmonic scalpel as well and the bladder flap created with the harmonic scalpel and removed away from the uterus.  The left fallopian tube was cauterized and cut with the harmonic and removed via the 10  mm umbilical port.  Both uterine arteries were cauterized and cut with harmonic scalpel.  The harmonic was used to circumscribe the [facility] ring and free the uterus and cervix. The uterus was then pulled into the vagina. A 10 mm incision was made in the suprapubic area.  Both angles were sutured with 1 PDS.  The  remainder of the cuff was sutured with 1 PDS using interrupted stitches until the vaginal cuff was closed.  A total of approximately 5 sutures were used.   Irrigation was performed.  Gas was allowed to leave the abdomen to check for bleeders and all pedicles were seen to be hemostatic. The patient was given indigo carmine.  Cystoscopy was performed and both ureters were seen to efflux indigo carmine without difficulty. The bladder had full integrity with no suture or lacerations visualized.  The vagina was inspected and the cuff was noted to be intact but an area of mucosa was not approximated which was sutured with 0-vicryl.  Attention was then turned back to the abdomen after removing top pair of gloves. The abdomen was reinsufflated with CO2 gas.  The abdomen and pelvis was copiously irrigated and  hemostasis was noted.  The 10 mm suprapubic port was closed with 0 Vicryl.  All trochars were removed under direct visualization using the laparoscope.  The umbilical fascia was reapproximated using 0 vidryl. The two 10 mm incisions were closed with 3-0 Monocryl via a subcuticular stitch.  All remaining skin incisions were closed with Dermabond and the 10 mm skin incisions were reinforced using Dermabond.  Sponge lap and needle counts were correct.    Attention was turned to the perineum and a weighted speculum was  placed in the patient's vagina and the anterior vaginal wall was injected with dilute pitressin at a concentration of 20 units of pitressin in a total of 100cc of normal saline.  An incision was made in the anterior wall of the vagina for approximately 1cm beneath the midurethra and the underlying  tissue was dissected away from the anterior vaginal wall down to the level of the lower symphysis pubis bilaterally. Attention was then turned to the mons pubis where two 5 mm incisions were made 2 fingerbreadths from the midline. The transabdominal guide was then passed through the mons pubis incision on the patient's right down through the space of Retzius and out through the anterior vaginal wall after deflecting the rigid urethral catheter guide to the ipsilateral side. The same was done on the contralateral side. Cystoscopy was performed and no invadvertant bladder injury was noted. The bladder was drained with a Foley while deflecting the rigid urethral catheter guide to the patient's right and the mesh was attached to the transabdominal guide and elevated up through the space of Retzius and out through the incision on the mons pubis on the ipsilateral side. The same was done on the contralateral side. Cystoscopy was performed again and no inadvertant bladder injury was noted. The 8 French Foley was left in the urethra and a large Tresa Endo was placed between the urethra and the mesh in order to leave the mesh slack beneath the midurethra. The mesh was then cut flush with the skin at the mons pubis incisions bilaterally. Indigo carmine had been administered, cystoscopy was performed again and bilateral ureters were noted to efflux without difficulty. The bilateral incisions on the mons pubis were then cleaned and Dermabond applied. The anterior vaginal wall incision was repaired with 2-0 vicryl with interrupted stitches.    Attention was then turned to the remainder of the anterior vaginal wall where dilute pitressin was administered overlying the cystocele. An incision was made and the underlying tissue dissected away from the anterior vaginal wall. The cystocele was repaired using Kelly plication stitches of 2-0 Vicryl.  The overlying anterior vaginal wall was then repaired using 3-0 Vicryl via interrupted  stitches. Attention was then turned to the posterior vaginal wall where dilute Pitressin was injected. The posterior vaginal wall was incised and the underlying tissue was dissected away from the posterior vaginal wall. The rectocele was repaired using plication stitches of 2-0 Vicryl after repairing enterocele with 3-0 vicryl purse string stitch.  The perineal body was reinforced with 0 vicryl.  The overlying posterior vaginal wall tissue was repaired with 2-0 Vicryl via a running interlocking stitch. The perineum was repaired with 2-0 Vicryl via subcutaneous stitch. Cystoscopy was performed and bilateral ureters were noted to efflux without difficulty and there were no inadvertent bladder injuries. The vagina was packed with estrogen-soaked packing.    Sponge, lap and needle count was correct.  The patient tolerated the procedure well and was returned to the recovery room in good condition.

## 2012-11-06 NOTE — Progress Notes (Signed)
Day of Surgery Procedure(s) (LRB): HYSTERECTOMY TOTAL LAPAROSCOPIC (N/A) ANTERIOR (CYSTOCELE) AND POSTERIOR REPAIR (RECTOCELE) (N/A) TRANSVAGINAL TAPE (TVT) PROCEDURE (N/A) UNILATERAL SALPINGECTOMY (Left) CYSTOSCOPY (N/A)  Subjective: Patient reports no complaints.  Requesting water.  Just got to the floor because of slow breathing in the recovery room.  Breathing normally now.    Objective: I have reviewed patient's vital signs and intake and output.  UOP 125cc/about 1hr  General: alert and no distress Resp: clear to auscultation bilaterally Cardio: regular rate and rhythm GI: soft, app tender, incisions c/d and dermabond intact, +BS, ND Extremities: Homans sign is negative, no sign of DVT Vaginal Bleeding: vaginal packing in place  Assessment: s/p Procedure(s): HYSTERECTOMY TOTAL LAPAROSCOPIC (N/A) ANTERIOR (CYSTOCELE) AND POSTERIOR REPAIR (RECTOCELE) (N/A) TRANSVAGINAL TAPE (TVT) PROCEDURE (N/A) UNILATERAL SALPINGECTOMY (Left) CYSTOSCOPY (N/A): stable  Plan: POD#0  Recovering well May have clears, advance diet as tolerated.  SCDs for DVT prophylaxis.  CBC in am. Good UOP Encourage IS  LOS: 0 days    ROBERTS,ANGELA Y 11/06/2012, 10:13 PM

## 2012-11-06 NOTE — Anesthesia Preprocedure Evaluation (Signed)
Anesthesia Evaluation  Patient identified by MRN, date of birth, ID band Patient awake    Reviewed: Allergy & Precautions, H&P , NPO status , Patient's Chart, lab work & pertinent test results  Airway Mallampati: III TM Distance: >3 FB Neck ROM: Full    Dental no notable dental hx. (+) Teeth Intact   Pulmonary asthma ,  breath sounds clear to auscultation  Pulmonary exam normal       Cardiovascular hypertension, Pt. on medications and Pt. on home beta blockers Rhythm:Regular Rate:Normal     Neuro/Psych negative neurological ROS  negative psych ROS   GI/Hepatic Neg liver ROS, GERD-  Medicated and Controlled,  Endo/Other  negative endocrine ROS  Renal/GU negative Renal ROS Bladder dysfunction  Pelvic Relaxation SUI    Musculoskeletal negative musculoskeletal ROS (+)   Abdominal   Peds  Hematology negative hematology ROS (+)   Anesthesia Other Findings   Reproductive/Obstetrics negative OB ROS                           Anesthesia Physical Anesthesia Plan  ASA: II  Anesthesia Plan: General   Post-op Pain Management:    Induction: Intravenous  Airway Management Planned: Oral ETT  Additional Equipment:   Intra-op Plan:   Post-operative Plan: Extubation in OR  Informed Consent: I have reviewed the patients History and Physical, chart, labs and discussed the procedure including the risks, benefits and alternatives for the proposed anesthesia with the patient or authorized representative who has indicated his/her understanding and acceptance.   Dental advisory given  Plan Discussed with: CRNA, Anesthesiologist and Surgeon  Anesthesia Plan Comments:         Anesthesia Quick Evaluation

## 2012-11-06 NOTE — Anesthesia Postprocedure Evaluation (Signed)
Anesthesia Post Note  Patient: Megan [name]  Procedure(s) Performed: Procedure(s) (LRB): HYSTERECTOMY TOTAL LAPAROSCOPIC (N/A) ANTERIOR (CYSTOCELE) AND POSTERIOR REPAIR (RECTOCELE) (N/A) TRANSVAGINAL TAPE (TVT) PROCEDURE (N/A) UNILATERAL SALPINGECTOMY (Right) CYSTOSCOPY (N/A)  Anesthesia type: General  Patient location: PACU  Post pain: Pain level controlled  Post assessment: Post-op Vital signs reviewed  Last Vitals:  Filed Vitals:   11/06/12 1900  BP: 139/62  Pulse: 97  Temp:   Resp: 14    Post vital signs: Reviewed  Level of consciousness: sedated  Complications: No apparent anesthesia complications

## 2012-11-07 ENCOUNTER — Encounter (HOSPITAL_COMMUNITY): Payer: Self-pay | Admitting: Obstetrics and Gynecology

## 2012-11-07 LAB — CBC
HCT: 33.2 % — ABNORMAL LOW (ref 36.0–46.0)
Hemoglobin: 11.3 g/dL — ABNORMAL LOW (ref 12.0–15.0)
MCH: 29 pg (ref 26.0–34.0)
MCHC: 34 g/dL (ref 30.0–36.0)
MCV: 85.3 fL (ref 78.0–100.0)
Platelets: 299 10*3/uL (ref 150–400)
RBC: 3.89 MIL/uL (ref 3.87–5.11)
RDW: 12.5 % (ref 11.5–15.5)
WBC: 17.9 10*3/uL — ABNORMAL HIGH (ref 4.0–10.5)

## 2012-11-07 MED ORDER — OXYCODONE-ACETAMINOPHEN 5-325 MG PO TABS: 1.0000 | ORAL_TABLET | ORAL | Status: DC | PRN

## 2012-11-07 MED ORDER — IBUPROFEN 600 MG PO TABS: 600.0000 mg | ORAL_TABLET | Freq: Four times a day (QID) | ORAL | Status: DC | PRN

## 2012-11-07 MED ORDER — ONDANSETRON HCL 4 MG PO TABS: 4.0000 mg | ORAL_TABLET | Freq: Three times a day (TID) | ORAL | Status: DC | PRN

## 2012-11-07 NOTE — Anesthesia Postprocedure Evaluation (Signed)
  Anesthesia Post-op Note  Patient: Megan [name]  Procedure(s) Performed: Procedure(s): HYSTERECTOMY TOTAL LAPAROSCOPIC (N/A) ANTERIOR (CYSTOCELE) AND POSTERIOR REPAIR (RECTOCELE) (N/A) TRANSVAGINAL TAPE (TVT) PROCEDURE (N/A) UNILATERAL SALPINGECTOMY (Right) CYSTOSCOPY (N/A)  Patient Location: [facility]'s unit  Anesthesia Type:General  Level of Consciousness: awake, alert  and oriented  Airway and Oxygen Therapy: Patient Spontanous Breathing and Patient connected to nasal cannula oxygen  Post-op Pain: none  Post-op Assessment: Post-op Vital signs reviewed and Patient's Cardiovascular Status Stable  Post-op Vital Signs: Reviewed and stable  Complications: No apparent anesthesia complications

## 2012-11-07 NOTE — Discharge Summary (Addendum)
Physician Discharge Summary  Patient ID: Megan [name] MRN: [id number] DOB/AGE: [birth year] 57 y.o.  Admit date: 11/06/2012 Discharge date: 11/07/2012   Discharge Diagnoses:  Active Problems:   * No active hospital problems. *   Operation: TLH, A-P Repair, Left Salpingectomy, Cystoscopy   Discharged Condition: good  Hospital Course: Pt admitted for planned surgery secondary to pelvic relaxation, incontinence who is s/p the above mentioned surgery.  On pod#1 she voided without difficulty and had BM still somewhat loose from bowel prep and starting to be more formed now.  There is some vaginal blood tinged discharge that has decreased since packing came out.  D/C instructions (including calling if d/c does not get better and better and better) were discussed at length and questions answered.  6wk post op appt has already been scheduled.  Pt says she does not need any discharge pain medicine orders for them were written in case she changes her mind.  Disposition: good  Discharge Medications:    Medication List    TAKE these medications       amitriptyline 150 MG tablet  Commonly known as:  ELAVIL  Take 150 mg by mouth at bedtime.     fish oil-omega-3 fatty acids 1000 MG capsule  Take 2 g by mouth daily.     hydrochlorothiazide 12.5 MG capsule  Commonly known as:  MICROZIDE  Take 12.5 mg by mouth daily.     ibuprofen 600 MG tablet  Commonly known as:  ADVIL,MOTRIN  Take 1 tablet (600 mg total) by mouth every 6 (six) hours as needed (mild pain).     levocetirizine 5 MG tablet  Commonly known as:  XYZAL  Take 5 mg by mouth every evening.     metoprolol 50 MG tablet  Commonly known as:  LOPRESSOR  Take 50 mg by mouth 2 (two) times daily.     ondansetron 4 MG tablet  Commonly known as:  ZOFRAN  Take 1 tablet (4 mg total) by mouth every 8 (eight) hours as needed for nausea.     OVER THE COUNTER MEDICATION  Take 1 tablet by mouth daily. Calcium, Magnesium, Zinc, Vit D  supplement     oxyCODONE-acetaminophen 5-325 MG per tablet  Commonly known as:  PERCOCET/ROXICET  Take 1-2 tablets by mouth every 4 (four) hours as needed.     ranitidine 150 MG tablet  Commonly known as:  ZANTAC  Take 150 mg by mouth daily.     SOLUBLE FIBER/PROBIOTICS PO  Take by mouth.          Follow-up: Dr. Su Hilt,  December 18, 2012 at 2 p.m.   SignedHenreitta Leber, PA-C 11/07/2012, 7:10 AM

## 2012-11-07 NOTE — Progress Notes (Signed)
Pt is discharged in the care of husband Downstairs per wheelchair. Stable. Abdominal incios are clean and dry. Denies heavy vaginal bleeding, pain or temperature. Discharge instructions with Rx were given to pt. Understood all instructions well Questions asked and answered.

## 2012-11-07 NOTE — Progress Notes (Signed)
Packing removed at 0558... Pt ambulating in the hall after packing removed... Once pt got back to room went to bathroom, stated she "could not pee."  When pt stood up 200 cc's of blood noted in white hat in commode, small amt of staining on pad.Larey Dresser, PA at bedside and notified of the changes... Will continue to monitor pt.

## 2012-11-07 NOTE — Progress Notes (Signed)
Megan [name] is a19 y.o.  [id number]  Post Op Date # 1; TLH/Left Salpingectomy/TVT/A-P Repair/Cystoscopy  Subjective: Patient is Doing well postoperatively. Patient has The patient is not having any pain., Ambulating without difficulty, denies dizziness or nausea and is tolerating liquids.  Hasn't voided since vaginal packing and Foley have been removed.  Objective: Vital signs in last 24 hours: Temp:  [97.1 F (36.2 C)-98.7 F (37.1 C)] 98.4 F (36.9 C) (05/15 0546) Pulse Rate:  [83-111] 95 (05/15 0546) Resp:  [6-20] 18 (05/15 0558) BP: (111-156)/(61-92) 121/63 mmHg (05/15 0546) SpO2:  [92 %-100 %] 100 % (05/15 0558) Weight:  [156 lb (70.761 kg)] 156 lb (70.761 kg) (05/14 2225)  Intake/Output from previous day: 05/14 0701 - 05/15 0700 In: 4525 [I.V.:4525] Out: 3350 [Urine:3000] Intake/Output this shift: Total I/O In: 825 [I.V.:825] Out: 2100 [Urine:2100] No results found for this basename: WBC, HGB, HCT, PLT,  in the last 168 hours  No results found for this basename: NA, K, CL, CO2, BUN, CREATININE, CALCIUM, LABALBU, PROT, BILITOT, ALKPHOS, ALT, AST, GLUCOSE,  in the last 168 hours  EXAM: General: alert, cooperative and no distress Resp: clear to auscultation bilaterally Cardio: regular rate and rhythm, S1, S2 normal, no murmur, click, rub or gallop GI: Bowel sounds present, soft, incisions intact without evidence of infection. Extremities: No calf tenderness and negative Homan's sign Vaginal pad currently clean (after vaginal packing was removed trickles of watery appearing blood followed)   Assessment: s/p Procedure(s): HYSTERECTOMY TOTAL LAPAROSCOPIC ANTERIOR (CYSTOCELE) AND POSTERIOR REPAIR (RECTOCELE) TRANSVAGINAL TAPE (TVT) PROCEDURE UNILATERAL SALPINGECTOMY CYSTOSCOPY: stable  Plan: Advance diet Encourage ambulation Advance to PO medication Discontinue IV fluids Observe for vaginal bleeding and consider discharge home.later today.  LOS: 1 day     Megan [name],ELMIRA, PA-C 11/07/2012 6:46 AM  Agree with above.  Discharge has decreased since packing was removed and pt has voided, had BM and ambulated without difficulty.  Will d/c home and d/c instructions have been discussed.

## 2012-11-07 NOTE — Progress Notes (Signed)
Vaginal packing removed... Moderate amount of bright red drainage noted throughout... Pt tolerated well... Pad replaced after removal... Will continue to monitor.

## 2012-12-06 ENCOUNTER — Other Ambulatory Visit (HOSPITAL_COMMUNITY): Payer: Self-pay | Admitting: Family Medicine

## 2012-12-06 DIAGNOSIS — Z1231 Encounter for screening mammogram for malignant neoplasm of breast: Secondary | ICD-10-CM

## 2013-01-09 ENCOUNTER — Ambulatory Visit (HOSPITAL_COMMUNITY)
Admission: RE | Admit: 2013-01-09 | Discharge: 2013-01-09 | Disposition: A | Discharge status: Home / Self Care | Payer: BC Managed Care – PPO | Source: Ambulatory Visit | Attending: Family Medicine | Admitting: Family Medicine

## 2013-01-09 DIAGNOSIS — Z1231 Encounter for screening mammogram for malignant neoplasm of breast: Secondary | ICD-10-CM | POA: Insufficient documentation

## 2013-12-15 ENCOUNTER — Other Ambulatory Visit (HOSPITAL_COMMUNITY): Payer: Self-pay | Admitting: Family Medicine

## 2013-12-15 DIAGNOSIS — Z1231 Encounter for screening mammogram for malignant neoplasm of breast: Secondary | ICD-10-CM

## 2014-01-14 ENCOUNTER — Ambulatory Visit (HOSPITAL_COMMUNITY)
Admission: RE | Admit: 2014-01-14 | Discharge: 2014-01-14 | Disposition: A | Discharge status: Home / Self Care | Payer: BC Managed Care – PPO | Source: Ambulatory Visit | Attending: Family Medicine | Admitting: Family Medicine

## 2014-01-14 DIAGNOSIS — Z1231 Encounter for screening mammogram for malignant neoplasm of breast: Secondary | ICD-10-CM | POA: Insufficient documentation

## 2014-01-15 ENCOUNTER — Other Ambulatory Visit: Payer: Self-pay | Admitting: Family Medicine

## 2014-01-15 DIAGNOSIS — R928 Other abnormal and inconclusive findings on diagnostic imaging of breast: Secondary | ICD-10-CM

## 2014-01-21 ENCOUNTER — Ambulatory Visit
Admission: RE | Admit: 2014-01-21 | Discharge: 2014-01-21 | Disposition: A | Discharge status: Home / Self Care | Payer: BC Managed Care – PPO | Source: Ambulatory Visit | Attending: Family Medicine | Admitting: Family Medicine

## 2014-01-21 DIAGNOSIS — R928 Other abnormal and inconclusive findings on diagnostic imaging of breast: Secondary | ICD-10-CM

## 2014-12-25 ENCOUNTER — Other Ambulatory Visit (HOSPITAL_COMMUNITY): Payer: Self-pay | Admitting: Family Medicine

## 2014-12-25 DIAGNOSIS — Z1231 Encounter for screening mammogram for malignant neoplasm of breast: Secondary | ICD-10-CM

## 2015-01-25 ENCOUNTER — Ambulatory Visit (HOSPITAL_COMMUNITY)
Admission: RE | Admit: 2015-01-25 | Discharge: 2015-01-25 | Disposition: A | Discharge status: Home / Self Care | Payer: BC Managed Care – PPO | Source: Ambulatory Visit | Attending: Family Medicine | Admitting: Family Medicine

## 2015-01-25 DIAGNOSIS — Z1231 Encounter for screening mammogram for malignant neoplasm of breast: Secondary | ICD-10-CM | POA: Diagnosis present

## 2015-05-21 ENCOUNTER — Encounter (HOSPITAL_COMMUNITY): Payer: Self-pay

## 2015-05-21 ENCOUNTER — Inpatient Hospital Stay (HOSPITAL_COMMUNITY)
Admission: AD | Admit: 2015-05-21 | Discharge: 2015-05-26 | DRG: 881 | Disposition: A | Discharge status: Home / Self Care | Payer: BC Managed Care – PPO | Source: Intra-hospital | Attending: Psychiatry | Admitting: Psychiatry

## 2015-05-21 ENCOUNTER — Emergency Department (HOSPITAL_COMMUNITY)
Admission: EM | Admit: 2015-05-21 | Discharge: 2015-05-21 | Disposition: A | Discharge status: Other Acute Inpatient Hospital | Payer: BC Managed Care – PPO | Attending: Emergency Medicine | Admitting: Emergency Medicine

## 2015-05-21 DIAGNOSIS — F341 Dysthymic disorder: Principal | ICD-10-CM | POA: Diagnosis present

## 2015-05-21 DIAGNOSIS — F411 Generalized anxiety disorder: Secondary | ICD-10-CM | POA: Diagnosis present

## 2015-05-21 DIAGNOSIS — M1990 Unspecified osteoarthritis, unspecified site: Secondary | ICD-10-CM | POA: Diagnosis not present

## 2015-05-21 DIAGNOSIS — F419 Anxiety disorder, unspecified: Secondary | ICD-10-CM | POA: Insufficient documentation

## 2015-05-21 DIAGNOSIS — I10 Essential (primary) hypertension: Secondary | ICD-10-CM | POA: Insufficient documentation

## 2015-05-21 DIAGNOSIS — G478 Other sleep disorders: Secondary | ICD-10-CM | POA: Diagnosis not present

## 2015-05-21 DIAGNOSIS — Z872 Personal history of diseases of the skin and subcutaneous tissue: Secondary | ICD-10-CM | POA: Insufficient documentation

## 2015-05-21 DIAGNOSIS — F329 Major depressive disorder, single episode, unspecified: Secondary | ICD-10-CM | POA: Insufficient documentation

## 2015-05-21 DIAGNOSIS — F5105 Insomnia due to other mental disorder: Secondary | ICD-10-CM | POA: Diagnosis not present

## 2015-05-21 DIAGNOSIS — Z79899 Other long term (current) drug therapy: Secondary | ICD-10-CM | POA: Diagnosis not present

## 2015-05-21 DIAGNOSIS — F332 Major depressive disorder, recurrent severe without psychotic features: Secondary | ICD-10-CM | POA: Diagnosis not present

## 2015-05-21 DIAGNOSIS — F32A Depression, unspecified: Secondary | ICD-10-CM

## 2015-05-21 DIAGNOSIS — Z8719 Personal history of other diseases of the digestive system: Secondary | ICD-10-CM | POA: Insufficient documentation

## 2015-05-21 DIAGNOSIS — G4700 Insomnia, unspecified: Secondary | ICD-10-CM | POA: Diagnosis not present

## 2015-05-21 DIAGNOSIS — F418 Other specified anxiety disorders: Secondary | ICD-10-CM | POA: Diagnosis present

## 2015-05-21 DIAGNOSIS — G479 Sleep disorder, unspecified: Secondary | ICD-10-CM

## 2015-05-21 DIAGNOSIS — J45909 Unspecified asthma, uncomplicated: Secondary | ICD-10-CM | POA: Diagnosis not present

## 2015-05-21 LAB — CBC
HCT: 45.5 % (ref 36.0–46.0)
Hemoglobin: 15 g/dL (ref 12.0–15.0)
MCH: 29.6 pg (ref 26.0–34.0)
MCHC: 33 g/dL (ref 30.0–36.0)
MCV: 89.7 fL (ref 78.0–100.0)
Platelets: 443 10*3/uL — ABNORMAL HIGH (ref 150–400)
RBC: 5.07 MIL/uL (ref 3.87–5.11)
RDW: 12.4 % (ref 11.5–15.5)
WBC: 10.4 10*3/uL (ref 4.0–10.5)

## 2015-05-21 LAB — COMPREHENSIVE METABOLIC PANEL
ALT: 23 U/L (ref 14–54)
AST: 20 U/L (ref 15–41)
Albumin: 4.7 g/dL (ref 3.5–5.0)
Alkaline Phosphatase: 81 U/L (ref 38–126)
Anion gap: 5 (ref 5–15)
BUN: 14 mg/dL (ref 6–20)
CO2: 30 mmol/L (ref 22–32)
Calcium: 9.4 mg/dL (ref 8.9–10.3)
Chloride: 102 mmol/L (ref 101–111)
Creatinine, Ser: 0.75 mg/dL (ref 0.44–1.00)
GFR calc Af Amer: >60 mL/min (ref >60)
GFR calc non Af Amer: >60 mL/min (ref >60)
Glucose, Bld: 104 mg/dL — ABNORMAL HIGH (ref 65–99)
Potassium: 4 mmol/L (ref 3.5–5.1)
Sodium: 137 mmol/L (ref 135–145)
Total Bilirubin: 0.5 mg/dL (ref 0.3–1.2)
Total Protein: 7.8 g/dL (ref 6.5–8.1)

## 2015-05-21 LAB — SALICYLATE LEVEL: Salicylate Lvl: <4 mg/dL (ref 2.8–30.0)

## 2015-05-21 LAB — RAPID URINE DRUG SCREEN, HOSP PERFORMED
Amphetamines: NOT DETECTED
Barbiturates: NOT DETECTED
Benzodiazepines: NOT DETECTED
Cocaine: NOT DETECTED
Opiates: NOT DETECTED
Tetrahydrocannabinol: NOT DETECTED

## 2015-05-21 LAB — ACETAMINOPHEN LEVEL: Acetaminophen (Tylenol), Serum: <10 ug/mL — ABNORMAL LOW (ref 10–30)

## 2015-05-21 LAB — ETHANOL: Alcohol, Ethyl (B): <5 mg/dL (ref <5)

## 2015-05-21 MED ORDER — ACETAMINOPHEN 325 MG PO TABS: 650.0000 mg | ORAL_TABLET | Freq: Four times a day (QID) | ORAL | Status: DC | PRN

## 2015-05-21 MED ORDER — MAGNESIUM HYDROXIDE 400 MG/5ML PO SUSP: 30.0000 mL | Freq: Every day | ORAL | Status: DC | PRN

## 2015-05-21 MED ORDER — ALPRAZOLAM 0.5 MG PO TABS
0.5000 mg | ORAL_TABLET | Freq: Every evening | ORAL | Status: DC | PRN
  Administered 2015-05-23 (×2): 0.5 mg via ORAL
  Filled 2015-05-21 (×3): qty 1

## 2015-05-21 MED ORDER — HYDROXYZINE HCL 50 MG PO TABS
50.0000 mg | ORAL_TABLET | Freq: Four times a day (QID) | ORAL | Status: DC | PRN
  Administered 2015-05-21 – 2015-05-22 (×2): 50 mg via ORAL
  Filled 2015-05-21 (×2): qty 1

## 2015-05-21 MED ORDER — METOPROLOL TARTRATE 50 MG PO TABS
50.0000 mg | ORAL_TABLET | Freq: Two times a day (BID) | ORAL | Status: DC
  Administered 2015-05-21 – 2015-05-26 (×10): 50 mg via ORAL
  Filled 2015-05-21 (×15): qty 1

## 2015-05-21 MED ORDER — NORTRIPTYLINE HCL 25 MG PO CAPS
50.0000 mg | ORAL_CAPSULE | Freq: Two times a day (BID) | ORAL | Status: DC
  Filled 2015-05-21 (×2): qty 2

## 2015-05-21 MED ORDER — ALUM & MAG HYDROXIDE-SIMETH 200-200-20 MG/5ML PO SUSP: 30.0000 mL | ORAL | Status: DC | PRN

## 2015-05-21 MED ORDER — NORTRIPTYLINE HCL 25 MG PO CAPS
50.0000 mg | ORAL_CAPSULE | Freq: Once | ORAL | Status: AC
  Administered 2015-05-21: 50 mg via ORAL
  Filled 2015-05-21 (×2): qty 2

## 2015-05-21 NOTE — Discharge Instructions (Signed)
Continue taking your medications as prescribed. [facility] being discharged from the emergency department.

## 2015-05-21 NOTE — ED Notes (Signed)
Pt sent from [facility].  Pt not sleeping.  Depressed.  Afraid she is going to snap.  Recent med change.  Here for labs and pend back to [facility] when cleared.

## 2015-05-21 NOTE — BH Assessment (Signed)
Per Mickel Baas, NP - patient meets criteria for inpatient counseling.  Per [facility] Otila Kluver) patient accepted to [facility] Bed 407-1.  [facility] will transport to [facility] ED for medical clearance due to increased memory loss.  Writer informed the Charge Nurse Manuela Schwartz) that the patient has a bed at [facility]

## 2015-05-21 NOTE — ED Provider Notes (Signed)
CSN: [id number]     Arrival date & time 05/21/15  1655 History   First MD Initiated Contact with Patient 05/21/15 1738     Chief Complaint  Patient presents with  . Depression     (Consider location/radiation/quality/duration/timing/severity/associated sxs/prior Treatment) HPI   Patient is a 60 year old female with history of depression, anxiety who presents to the ED for medical clearance. Patient reports she went to [facility] today where she has a bed reserved for her but notes she was advised to come to the ED for med clearance. She notes she was taking Amytriptyline but in September her psychiatrist d/c her medication resulting in her having insomnia for the past few weeks. She notes she has since been on nortriptyline and Xanax without improvement of insomnia. She also reports she has started to become anxious over the past week to her insomnia. Denies any other complaints at this time. Denies SI/HI or visual/auditory hallucinations. Endorses drinking alcohol occasionally, denies any street drugs.  Past Medical History  Diagnosis Date  . Hypertension   . Insomnia 1995  . Urticaria   . Incontinence   . Arthritis   . Asthma     no inhaler use "for months"  . GERD (gastroesophageal reflux disease)    Past Surgical History  Procedure Laterality Date  . Orthoscopic knee surgery  2009, 2011    x2  . Diagnostic laparoscopy    . Laparoscopic hysterectomy N/A 11/06/2012    Procedure: HYSTERECTOMY TOTAL LAPAROSCOPIC;  Surgeon: Delice Lesch, MD;  Location: [facility];  Service: Gynecology;  Laterality: N/A;  . Anterior and posterior repair N/A 11/06/2012    Procedure: ANTERIOR (CYSTOCELE) AND POSTERIOR REPAIR (RECTOCELE);  Surgeon: Delice Lesch, MD;  Location: [facility];  Service: Gynecology;  Laterality: N/A;  . Bladder suspension N/A 11/06/2012    Procedure: TRANSVAGINAL TAPE (TVT) PROCEDURE;  Surgeon: Delice Lesch, MD;  Location: [facility];  Service: Gynecology;  Laterality: N/A;  .  Unilateral salpingectomy Right 11/06/2012    Procedure: UNILATERAL SALPINGECTOMY;  Surgeon: Delice Lesch, MD;  Location: [facility];  Service: Gynecology;  Laterality: Right;  . Cystoscopy N/A 11/06/2012    Procedure: CYSTOSCOPY;  Surgeon: Delice Lesch, MD;  Location: [facility];  Service: Gynecology;  Laterality: N/A;   Family History  Problem Relation Age of Onset  . Hypertension Father   . Arthritis Father   . Glaucoma Father   . Cancer Mother    Social History  Substance Use Topics  . Smoking status: Never Smoker   . Smokeless tobacco: None  . Alcohol Use: No   OB History    No data available     Review of Systems  Psychiatric/Behavioral: Positive for sleep disturbance (decreased).       Anxiety  All other systems reviewed and are negative.     Allergies  Advair diskus  Home Medications   Prior to Admission medications   Medication Sig Start Date End Date Taking? Authorizing Provider  ALPRAZolam Duanne Moron) 0.5 MG tablet take 1 tablet by mouth at bedtime if needed for insomnia 05/13/15  Yes Historical Provider, MD  Eszopiclone 3 MG TABS take 1 tablet by mouth at bedtime if needed for sleep 05/18/15  Yes Historical Provider, MD  levocetirizine (XYZAL) 5 MG tablet Take 5 mg by mouth daily.    Yes Historical Provider, MD  Melatonin 3 MG TABS Take 6 mg by mouth at bedtime as needed (sleep).   Yes Historical Provider, MD  metoprolol (LOPRESSOR) 50  MG tablet Take 50 mg by mouth 2 (two) times daily.   Yes Historical Provider, MD  nortriptyline (PAMELOR) 25 MG capsule Take 1 capsule at bedtime 05/13/15  Yes Historical Provider, MD  OVER THE COUNTER MEDICATION Take 1 tablet by mouth daily. Calcium, Magnesium, Zinc, Vit D supplement   Yes Historical Provider, MD  ibuprofen (ADVIL,MOTRIN) 600 MG tablet Take 1 tablet (600 mg total) by mouth every 6 (six) hours as needed (mild pain). Patient not taking: Reported on 05/21/2015 11/07/12   Earnstine Regal, PA-C  ondansetron (ZOFRAN) 4 MG  tablet Take 1 tablet (4 mg total) by mouth every 8 (eight) hours as needed for nausea. Patient not taking: Reported on 05/21/2015 11/07/12   Earnstine Regal, PA-C  oxyCODONE-acetaminophen (PERCOCET/ROXICET) 5-325 MG per tablet Take 1-2 tablets by mouth every 4 (four) hours as needed. Patient not taking: Reported on 05/21/2015 11/07/12   Earnstine Regal, PA-C   BP 150/80 mmHg  Pulse 83  Temp(Src) 98.3 F (36.8 C) (Oral)  Resp 16  SpO2 99% Physical Exam  Constitutional: She is oriented to person, place, and time. She appears well-developed and well-nourished. No distress.  HENT:  Head: Normocephalic and atraumatic.  Mouth/Throat: Oropharynx is clear and moist. No oropharyngeal exudate.  Eyes: Conjunctivae and EOM are normal. Pupils are equal, round, and reactive to light. Right eye exhibits no discharge. Left eye exhibits no discharge. No scleral icterus.  Neck: Normal range of motion. Neck supple.  Cardiovascular: Normal rate, regular rhythm, normal heart sounds and intact distal pulses.   Pulmonary/Chest: Effort normal and breath sounds normal. No respiratory distress. She has no wheezes. She has no rales. She exhibits no tenderness.  Abdominal: Soft. Bowel sounds are normal. She exhibits no distension and no mass. There is no tenderness. There is no rebound and no guarding.  Musculoskeletal: Normal range of motion. She exhibits no edema.  Lymphadenopathy:    She has no cervical adenopathy.  Neurological: She is alert and oriented to person, place, and time.  Skin: Skin is warm and dry.  Psychiatric: She has a normal mood and affect. Her speech is normal and behavior is normal. Judgment and thought content normal. Cognition and memory are normal.  Nursing note and vitals reviewed.   ED Course  Procedures (including critical care time) Labs Review Labs Reviewed  COMPREHENSIVE METABOLIC PANEL - Abnormal; Notable for the following:    Glucose, Bld 104 (*)    All other components within  normal limits  ACETAMINOPHEN LEVEL - Abnormal; Notable for the following:    Acetaminophen (Tylenol), Serum <10 (*)    All other components within normal limits  CBC - Abnormal; Notable for the following:    Platelets 443 (*)    All other components within normal limits  ETHANOL  SALICYLATE LEVEL  URINE RAPID DRUG SCREEN, HOSP PERFORMED    Imaging Review No results found. I have personally reviewed and evaluated these images and lab results as part of my medical decision-making.    MDM   Final diagnoses:  Depression  Anxiety  Sleep disturbance    Patient presents for medical clearance, she has a bed reserved at [facility]. She endorses having depression and notes she has switched from Amytriptyline to Nortriptyline and Xanax. She reports having dec. Sleep for the past week which has also resulted in her feeling anxious. Denies SI/HI or hallucinations. VSS. Exam unremarkable. Labs unremarkable, pt medically cleared. Plan to d/c pt back to [facility]. Discussed results and plan for d/c with pt.  Chesley Noon [place], [place] 05/21/15 1938  Forde Dandy, MD 05/22/15 [phone]

## 2015-05-21 NOTE — BH Assessment (Signed)
Assessment Note  Megan [name] is an 59 y.o. female that reports SI with a plan to overdose on her medication.  Patient reports that she is just not able to live like this anymore.  Patient reports a history of depression and anxiety; however, since she was taken off dlavil and placed on remaron and trazadon it has become unbearable.   Patient reports increased [name] and anxiety while taking elavil.  Patient reports that she has taken elavil for over 21 years and it was prescribed her PC.  Patient has been going to see Dr. Cyndi [name] since August and he took her off this medication and placed her on remaron.  Patient reports that she since August she has begun to spiral downward.  Patient reports incidents in which she is up for 2 days straight and she is not able to sleep and her anxiety is getting worst.    Patient reports that she works as a Building control surveyor for [employer] and she has not been able to function, she has lost energy and the ability to get out of bed.  Patient denies physical, sexual or emotional abuse.  Patient denies SI and Psychosis.  Patient denies prior outpatient mental health counseling.  Patient denies prior psychiatric hospitalization.   Patient reports that she has been married for 30 years and has positive family supports.       Diagnosis: Major Depressive Disorder   Past Medical History:  Past Medical History  Diagnosis Date  . Hypertension   . Insomnia 1995  . Urticaria   . Incontinence   . Arthritis   . Asthma     no inhaler use "for months"  . GERD (gastroesophageal reflux disease)     Past Surgical History  Procedure Laterality Date  . Orthoscopic knee surgery  2009, 2011    x2  . Diagnostic laparoscopy    . Laparoscopic hysterectomy N/A 11/06/2012    Procedure: HYSTERECTOMY TOTAL LAPAROSCOPIC;  Surgeon: Delice Lesch, MD;  Location: [facility];  Service: Gynecology;  Laterality: N/A;  . Anterior and posterior repair N/A 11/06/2012    Procedure:  ANTERIOR (CYSTOCELE) AND POSTERIOR REPAIR (RECTOCELE);  Surgeon: Delice Lesch, MD;  Location: [facility];  Service: Gynecology;  Laterality: N/A;  . Bladder suspension N/A 11/06/2012    Procedure: TRANSVAGINAL TAPE (TVT) PROCEDURE;  Surgeon: Delice Lesch, MD;  Location: [facility];  Service: Gynecology;  Laterality: N/A;  . Unilateral salpingectomy Right 11/06/2012    Procedure: UNILATERAL SALPINGECTOMY;  Surgeon: Delice Lesch, MD;  Location: [facility];  Service: Gynecology;  Laterality: Right;  . Cystoscopy N/A 11/06/2012    Procedure: CYSTOSCOPY;  Surgeon: Delice Lesch, MD;  Location: [facility];  Service: Gynecology;  Laterality: N/A;    Family History:  Family History  Problem Relation Age of Onset  . Hypertension Father   . Arthritis Father   . Glaucoma Father   . Cancer Mother     Social History:  reports that she has never smoked. She does not have any smokeless tobacco history on file. She reports that she does not drink alcohol or use illicit drugs.  Additional Social History:  Alcohol / Drug Use History of alcohol / drug use?: No history of alcohol / drug abuse  CIWA:   COWS:    Allergies:  Allergies  Allergen Reactions  . Advair Diskus [Fluticasone-Salmeterol]     continuous coughing    Home Medications:  (Not in a hospital admission)  OB/GYN Status:  No LMP recorded. Patient is postmenopausal.  General Assessment Data Location of Assessment: [facility] ([facility]) TTS Assessment: In system Is this a Tele or Face-to-Face Assessment?: Face-to-Face Is this an Initial Assessment or a Re-assessment for this encounter?: Initial Assessment Marital status: Single Maiden name: NA Is patient pregnant?: No Pregnancy Status: No Living Arrangements: Spouse/significant other Can pt return to current living arrangement?: Yes Admission Status: Voluntary Is patient capable of signing voluntary admission?: Yes Referral Source: Self/Family/Friend Insurance  type: [place] Screening Exam ([place]) Medical Exam completed:  (Sent to ED for medical clearance)  Crisis Care Plan Living Arrangements: Spouse/significant other Name of Psychiatrist: Dr. Cyndi [name]  Name of Therapist: None Reported  Education Status Is patient currently in school?: No Current Grade: NA Highest grade of school patient has completed: NA Name of school: NA Contact person: NA  Risk to self with the past 6 months Suicidal Ideation: Yes-Currently Present Has patient been a risk to self within the past 6 months prior to admission? : Yes Suicidal Intent: Yes-Currently Present Has patient had any suicidal intent within the past 6 months prior to admission? : Yes Is patient at risk for suicide?: Yes Suicidal Plan?: Yes-Currently Present Has patient had any suicidal plan within the past 6 months prior to admission? : Yes Specify Current Suicidal Plan: Overdose on medication  Access to Means: Yes Specify Access to Suicidal Means: Pills at home What has been your use of drugs/alcohol within the last 12 months?: None Reported Previous Attempts/Gestures: No How many times?: 0 Other Self Harm Risks: None Reported Triggers for Past Attempts: Other (Comment) (Unable to sleep) Intentional Self Injurious Behavior: None Family Suicide History: No Recent stressful life event(s): Other (Comment) (Unable to sleep) Persecutory voices/beliefs?: No Depression: Yes Depression Symptoms: Despondent, Tearfulness, Fatigue, Feeling worthless/self pity Substance abuse history and/or treatment for substance abuse?: No Suicide prevention information given to non-admitted patients: Yes  Risk to Others within the past 6 months Homicidal Ideation: No Does patient have any lifetime risk of violence toward others beyond the six months prior to admission? : No Thoughts of Harm to Others: No Current Homicidal Intent: No Current Homicidal Plan: No Access to Homicidal Means:  No Identified Victim: None Reported History of harm to others?: No Assessment of Violence: None Noted Violent Behavior Description: n Does patient have access to weapons?: No Criminal Charges Pending?: No Does patient have a court date: No Is patient on probation?: No  Psychosis Hallucinations: None noted Delusions: None noted  Mental Status Report Appearance/Hygiene: Unremarkable Eye Contact: Poor Motor Activity: Freedom of movement, Restlessness Speech: Logical/coherent Level of Consciousness: Alert Mood: Depressed, Anxious Affect: Anxious Anxiety Level: Minimal Thought Processes: Coherent, Relevant Judgement: Unimpaired Orientation: Person, Time, Place, Situation Obsessive Compulsive Thoughts/Behaviors: None  Cognitive Functioning Concentration: Decreased [name]: Recent Intact, Remote Intact IQ: Average Insight: Fair Impulse Control: Fair Appetite: Poor Weight Loss: 17 Weight Gain: 0 Sleep: Decreased Total Hours of Sleep: 0 (Unable to sleep ) Vegetative Symptoms: Decreased grooming, Staying in bed  ADLScreening [facility] Assessment Services) Patient's cognitive ability adequate to safely complete daily activities?: Yes Patient able to express need for assistance with ADLs?: Yes Independently performs ADLs?: Yes (appropriate for developmental age)  Prior Inpatient Therapy Prior Inpatient Therapy: No Prior Therapy Dates: NA Prior Therapy Facilty/Provider(s): NA Reason for Treatment: NA  Prior Outpatient Therapy Prior Outpatient Therapy: Yes Prior Therapy Dates: Ongoing  Prior Therapy Facilty/Provider(s): Dr. Cyndi [name]  Reason for Treatment: Medication Management Does patient have  an ACCT team?: No Does patient have Intensive In-House Services?  : No Does patient have [facility] services? : No Does patient have P4CC services?: No  ADL Screening (condition at time of admission) Patient's cognitive ability adequate to safely complete daily activities?: Yes Is the  patient deaf or have difficulty hearing?: No Does the patient have difficulty seeing, even when wearing glasses/contacts?: No Does the patient have difficulty concentrating, remembering, or making decisions?: Yes Patient able to express need for assistance with ADLs?: Yes Does the patient have difficulty dressing or bathing?: No Independently performs ADLs?: Yes (appropriate for developmental age) Does the patient have difficulty walking or climbing stairs?: No Weakness of Legs: None Weakness of Arms/Hands: None  Home Assistive Devices/Equipment Home Assistive Devices/Equipment: None    Abuse/Neglect Assessment (Assessment to be complete while patient is alone) Physical Abuse: Denies Verbal Abuse: Denies Sexual Abuse: Denies Exploitation of patient/patient's resources: Denies Self-Neglect: Denies Values / Beliefs Cultural Requests During Hospitalization: None Spiritual Requests During Hospitalization: None Consults Spiritual Care Consult Needed: No Social Work Consult Needed: No Regulatory affairs officer (For Healthcare) Does patient have an advance directive?: No Would patient like information on creating an advanced directive?: No - patient declined information    Additional Information 1:1 In Past 12 Months?: No CIRT Risk: No Elopement Risk: No Does patient have medical clearance?: Yes     Disposition: Per Mickel Baas, NP - patient meets criteria for inpatient counseling.  Per [facility] Otila Kluver) patient accepted to [facility] Bed 407-1.  [place] will transport to [facility] ED for medical clearance due to increased [name].  Writer informed the Charge Nurse Manuela Schwartz) that the patient has a bed at [facility]  Disposition Initial Assessment Completed for this Encounter: Yes Disposition of Patient: Inpatient treatment program Type of inpatient treatment program: Adult (Per Mickel Baas, NP - patient meets criteria for inpatient hosp)  On Site Evaluation by:   Reviewed with Physician:    Graciella Freer  [place] 05/21/2015 4:16 PM

## 2015-05-22 ENCOUNTER — Encounter (HOSPITAL_COMMUNITY): Payer: Self-pay | Admitting: Psychiatry

## 2015-05-22 DIAGNOSIS — F341 Dysthymic disorder: Principal | ICD-10-CM

## 2015-05-22 DIAGNOSIS — F418 Other specified anxiety disorders: Secondary | ICD-10-CM | POA: Diagnosis present

## 2015-05-22 DIAGNOSIS — F5105 Insomnia due to other mental disorder: Secondary | ICD-10-CM | POA: Diagnosis present

## 2015-05-22 DIAGNOSIS — F411 Generalized anxiety disorder: Secondary | ICD-10-CM | POA: Diagnosis present

## 2015-05-22 DIAGNOSIS — F332 Major depressive disorder, recurrent severe without psychotic features: Secondary | ICD-10-CM

## 2015-05-22 MED ORDER — QUETIAPINE FUMARATE 50 MG PO TABS
50.0000 mg | ORAL_TABLET | Freq: Every day | ORAL | Status: DC
  Administered 2015-05-22: 50 mg via ORAL
  Filled 2015-05-22 (×3): qty 1

## 2015-05-22 MED ORDER — ZOLPIDEM TARTRATE 10 MG PO TABS: 10.0000 mg | ORAL_TABLET | Freq: Every day | ORAL | Status: DC

## 2015-05-22 MED ORDER — NORTRIPTYLINE HCL 25 MG PO CAPS
75.0000 mg | ORAL_CAPSULE | Freq: Once | ORAL | Status: DC
  Filled 2015-05-22: qty 3

## 2015-05-22 MED ORDER — NORTRIPTYLINE HCL 25 MG PO CAPS
50.0000 mg | ORAL_CAPSULE | Freq: Every day | ORAL | Status: DC
  Filled 2015-05-22: qty 2

## 2015-05-22 MED ORDER — QUETIAPINE FUMARATE 50 MG PO TABS
50.0000 mg | ORAL_TABLET | Freq: Every day | ORAL | Status: DC
  Filled 2015-05-22 (×2): qty 1

## 2015-05-22 MED ORDER — NORTRIPTYLINE HCL 25 MG PO CAPS
50.0000 mg | ORAL_CAPSULE | Freq: Every day | ORAL | Status: DC
  Administered 2015-05-22 – 2015-05-23 (×2): 50 mg via ORAL
  Filled 2015-05-22 (×3): qty 2

## 2015-05-22 NOTE — [facility] Group Notes (Signed)
   04/24/2015     1:15-2:15PM  Summary of Progress/Problems:   In today's process group a decisional balance exercise was used to explore in depth the perceived benefits and costs of using the unhealthy coping techniques identified by each patient as one they often use, as well as the  benefits and costs of replacing these with healthy coping skills.  Four patients were very disruptive throughout group and very distracting both to the group process and to staying on task to talk about any meaningful feelings.  CSW addressed this repeatedly and redirected these individuals, but ultimately ended group early with an apology that it was not more productive.  The patient expressed that "stinkin' thinkin'" is her main unhealthy coping mechanism.  She did not talk during group and when CSW was required to put forth effort on deescalating other patients, she left the room.   Type of Therapy:  Group Therapy - Process   Participation Level:  Limited  Participation Quality:  Appropriate  Affect:  Blunted and Anxious  Cognitive:  Alert  Insight:  Improving  Engagement in Therapy:  Developing and Improving  Modes of Intervention:  Education, Motivational Interviewing  Selmer Dominion, LCSW 05/22/2015, 2:40 PM
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
## 2015-05-22 NOTE — H&P (Signed)
Psychiatric Admission Assessment Adult  Patient Identification: Megan [name]  MRN:  [id number]  Date of Evaluation:  05/22/2015  Chief Complaint:  MDD  Principal Diagnosis: Major depressive disorder, recurrent  Diagnosis:   Patient Active Problem List   Diagnosis Date Noted  . MDD (major depressive disorder) ([place]) [F32.9] 05/21/2015  . Urinary incontinence [R32] 08/07/2012  . Pelvic relaxation [N81.89] 08/07/2012  . Fibroids [D25.9] 08/07/2012   History of Present Illness: Megan [name] is a 59 year old Caucasian female. Admitted to [facility] as a walk-in, medically cleared at the [facility] She reports, "I have severe insomnia. It has been going on x 21 years. I was put on Elavil, has remained on this medicine for 21 years. I started to have memory problems, I suspected that it has to be due to the Elavil. I decided to see a psychiatrist this past August, 2016, who confirmed that the Elavil was the cause of my memory issues. I was also told that this medicine is bad & dirty.The psychiatrist decided to stop the Elavil & started me on Mirtazapine 30 mg, did not help much. He added Trazodone , which did not help. As of the 16th of this month, he added Nortriptyline, My body is very sensitive to medications. I never had a typical depression like my mother had, rather, I had the associated insomnia. I experienced memory problems for years since being on Amitriptyline. Then, when it was discontinued, I had bad withdrawal symptoms; fatigue, runny nose, weight loss, constant headaches, poor appetite & photophobia. I did not sleep at all last night.  Associated Signs/Symptoms:  Depression Symptoms:  insomnia, psychomotor agitation, difficulty concentrating, hopelessness, impaired memory, anxiety, loss of energy/fatigue, weight loss, (Hypo) Manic Symptoms:  Denies any symptoms Anxiety Symptoms:  Excessive Worry, Psychotic Symptoms:  Denies any symptoms  PTSD Symptoms: NA  Total Time spent  with patient: 1 hour  Past Psychiatric History: Insomnia  Risk to Self: Suicidal Ideation: Yes-Currently Present Suicidal Intent: Yes-Currently Present Is patient at risk for suicide?: Yes Suicidal Plan?: Yes-Currently Present Specify Current Suicidal Plan: Overdose on medication  Access to Means: Yes Specify Access to Suicidal Means: Pills at home What has been your use of drugs/alcohol within the last 12 months?: None Reported How many times?: 0 Other Self Harm Risks: None Reported Triggers for Past Attempts: Other (Comment) (Unable to sleep) Intentional Self Injurious Behavior: None Risk to Others: Homicidal Ideation: No Thoughts of Harm to Others: No Current Homicidal Intent: No Current Homicidal Plan: No Access to Homicidal Means: No Identified Victim: None Reported History of harm to others?: No Assessment of Violence: None Noted Violent Behavior Description: n Does patient have access to weapons?: No Criminal Charges Pending?: No Does patient have a court date: No Prior Inpatient Therapy: Prior Inpatient Therapy: No Prior Therapy Dates: NA Prior Therapy Facilty/Provider(s): NA Reason for Treatment: NA Prior Outpatient Therapy: Prior Outpatient Therapy: Yes Prior Therapy Dates: Ongoing  Prior Therapy Facilty/Provider(s): Dr. Cyndi Lennert  Reason for Treatment: Medication Management Does patient have an ACCT team?: No Does patient have Intensive In-House Services?  : No Does patient have [facility] services? : No Does patient have P4CC services?: No  Alcohol Screening: 1. How often do you have a drink containing alcohol?: Monthly or less 2. How many drinks containing alcohol do you have on a typical day when you are drinking?: 1 or 2 3. How often do you have six or more drinks on one occasion?: Never Preliminary Score: 0 4. How often during  the last year have you found that you were not able to stop drinking once you had started?: Never 5. How often during the last year have  you failed to do what was normally expected from you becasue of drinking?: Never 6. How often during the last year have you needed a first drink in the morning to get yourself going after a heavy drinking session?: Never 7. How often during the last year have you had a feeling of guilt of remorse after drinking?: Never 8. How often during the last year have you been unable to remember what happened the night before because you had been drinking?: Never 9. Have you or someone else been injured as a result of your drinking?: No 10. Has a relative or friend or a doctor or another health worker been concerned about your drinking or suggested you cut down?: No Alcohol Use Disorder Identification Test Final Score (AUDIT): 1 Brief Intervention: AUDIT score less than 7 or less-screening does not suggest unhealthy drinking-brief intervention not indicated Substance Abuse History in the last 12 months:  No.  Consequences of Substance Abuse: Medical Consequences:  Liver damage, Possible death by overdose Legal Consequences:  Arrests, jail time, Loss of driving privilege. Family Consequences:  Family discord, divorce and or separation.  Previous Psychotropic Medications: Yes, (Elavil, Nortriptyline, Remeron)  Psychological Evaluations: Yes   Past Medical History:  Past Medical History  Diagnosis Date  . Hypertension   . Insomnia 1995  . Urticaria   . Incontinence   . Arthritis   . Asthma     no inhaler use "for months"  . GERD (gastroesophageal reflux disease)     Past Surgical History  Procedure Laterality Date  . Orthoscopic knee surgery  2009, 2011    x2  . Diagnostic laparoscopy    . Laparoscopic hysterectomy N/A 11/06/2012    Procedure: HYSTERECTOMY TOTAL LAPAROSCOPIC;  Surgeon: Delice Lesch, MD;  Location: [facility];  Service: Gynecology;  Laterality: N/A;  . Anterior and posterior repair N/A 11/06/2012    Procedure: ANTERIOR (CYSTOCELE) AND POSTERIOR REPAIR (RECTOCELE);  Surgeon:  Delice Lesch, MD;  Location: [facility];  Service: Gynecology;  Laterality: N/A;  . Bladder suspension N/A 11/06/2012    Procedure: TRANSVAGINAL TAPE (TVT) PROCEDURE;  Surgeon: Delice Lesch, MD;  Location: [facility];  Service: Gynecology;  Laterality: N/A;  . Unilateral salpingectomy Right 11/06/2012    Procedure: UNILATERAL SALPINGECTOMY;  Surgeon: Delice Lesch, MD;  Location: [facility];  Service: Gynecology;  Laterality: Right;  . Cystoscopy N/A 11/06/2012    Procedure: CYSTOSCOPY;  Surgeon: Delice Lesch, MD;  Location: [facility];  Service: Gynecology;  Laterality: N/A;   Family History:  Family History  Problem Relation Age of Onset  . Hypertension Father   . Arthritis Father   . Glaucoma Father   . Cancer Mother    Family Psychiatric  History: Depression: Mother  Social History:  History  Alcohol Use No     History  Drug Use No    Social History   Social History  . Marital Status: Married    Spouse Name: N/A  . Number of Children: N/A  . Years of Education: N/A   Social History Main Topics  . Smoking status: Never Smoker   . Smokeless tobacco: None  . Alcohol Use: No  . Drug Use: No  . Sexual Activity: Yes    Birth Control/ Protection: Post-menopausal   Other Topics Concern  . None   Social  History Narrative   Additional Social History: Pain Medications: See PTA list Prescriptions: See PTA list Over the Counter: See PTA list History of alcohol / drug use?: No history of alcohol / drug abuse  Allergies:   Allergies  Allergen Reactions  . Advair Diskus [Fluticasone-Salmeterol]     continuous coughing   Lab Results:  Results for orders placed or performed during the hospital encounter of 05/21/15 (from the past 48 hour(s))  Comprehensive metabolic panel     Status: Abnormal   Collection Time: 05/21/15  5:47 PM  Result Value Ref Range   Sodium 137 135 - 145 mmol/L   Potassium 4.0 3.5 - 5.1 mmol/L   Chloride 102 101 - 111 mmol/L   CO2 30 22 - 32 mmol/L    Glucose, Bld 104 (H) 65 - 99 mg/dL   BUN 14 6 - 20 mg/dL   Creatinine, Ser 0.75 0.44 - 1.00 mg/dL   Calcium 9.4 8.9 - 10.3 mg/dL   Total Protein 7.8 6.5 - 8.1 g/dL   Albumin 4.7 3.5 - 5.0 g/dL   AST 20 15 - 41 U/L   ALT 23 14 - 54 U/L   Alkaline Phosphatase 81 38 - 126 U/L   Total Bilirubin 0.5 0.3 - 1.2 mg/dL   GFR calc non Af Amer >60 >60 mL/min   GFR calc Af Amer >60 >60 mL/min    Comment: (NOTE) The eGFR has been calculated using the CKD EPI equation. This calculation has not been validated in all clinical situations. eGFR's persistently <60 mL/min signify possible Chronic Kidney Disease.    Anion gap 5 5 - 15  Ethanol (ETOH)     Status: None   Collection Time: 05/21/15  5:47 PM  Result Value Ref Range   Alcohol, Ethyl (B) <5 <5 mg/dL    Comment:        LOWEST DETECTABLE LIMIT FOR SERUM ALCOHOL IS 5 mg/dL FOR MEDICAL PURPOSES ONLY   Salicylate level     Status: None   Collection Time: 05/21/15  5:47 PM  Result Value Ref Range   Salicylate Lvl <1.9 2.8 - 30.0 mg/dL  Acetaminophen level     Status: Abnormal   Collection Time: 05/21/15  5:47 PM  Result Value Ref Range   Acetaminophen (Tylenol), Serum <10 (L) 10 - 30 ug/mL    Comment:        THERAPEUTIC CONCENTRATIONS VARY SIGNIFICANTLY. A RANGE OF 10-30 ug/mL MAY BE AN EFFECTIVE CONCENTRATION FOR MANY PATIENTS. HOWEVER, SOME ARE BEST TREATED AT CONCENTRATIONS OUTSIDE THIS RANGE. ACETAMINOPHEN CONCENTRATIONS >150 ug/mL AT 4 HOURS AFTER INGESTION AND >50 ug/mL AT 12 HOURS AFTER INGESTION ARE OFTEN ASSOCIATED WITH TOXIC REACTIONS.   CBC     Status: Abnormal   Collection Time: 05/21/15  5:47 PM  Result Value Ref Range   WBC 10.4 4.0 - 10.5 K/uL   RBC 5.07 3.87 - 5.11 MIL/uL   Hemoglobin 15.0 12.0 - 15.0 g/dL   HCT 45.5 36.0 - 46.0 %   MCV 89.7 78.0 - 100.0 fL   MCH 29.6 26.0 - 34.0 pg   MCHC 33.0 30.0 - 36.0 g/dL   RDW 12.4 11.5 - 15.5 %   Platelets 443 (H) 150 - 400 K/uL  Urine rapid drug screen (hosp  performed) (Not at [facility])     Status: None   Collection Time: 05/21/15  5:47 PM  Result Value Ref Range   Opiates NONE DETECTED NONE DETECTED   Cocaine NONE DETECTED NONE DETECTED  Benzodiazepines NONE DETECTED NONE DETECTED   Amphetamines NONE DETECTED NONE DETECTED   Tetrahydrocannabinol NONE DETECTED NONE DETECTED   Barbiturates NONE DETECTED NONE DETECTED    Comment:        DRUG SCREEN FOR MEDICAL PURPOSES ONLY.  IF CONFIRMATION IS NEEDED FOR ANY PURPOSE, NOTIFY LAB WITHIN 5 DAYS.        LOWEST DETECTABLE LIMITS FOR URINE DRUG SCREEN Drug Class       Cutoff (ng/mL) Amphetamine      1000 Barbiturate      200 Benzodiazepine   583 Tricyclics       094 Opiates          300 Cocaine          300 THC              50    Metabolic Disorder Labs:  No results found for: HGBA1C, MPG No results found for: PROLACTIN No results found for: CHOL, TRIG, HDL, CHOLHDL, VLDL, LDLCALC  Current Medications: Current Facility-Administered Medications  Medication Dose Route Frequency Provider Last Rate Last Dose  . acetaminophen (TYLENOL) tablet 650 mg  650 mg Oral Q6H PRN Nicholaus Bloom, MD      . ALPRAZolam Duanne Moron) tablet 0.5 mg  0.5 mg Oral QHS PRN Nicholaus Bloom, MD      . alum & mag hydroxide-simeth (MAALOX/MYLANTA) 200-200-20 MG/5ML suspension 30 mL  30 mL Oral Q4H PRN Nicholaus Bloom, MD      . hydrOXYzine (ATARAX/VISTARIL) tablet 50 mg  50 mg Oral Q6H PRN Nicholaus Bloom, MD   50 mg at 05/21/15 2348  . magnesium hydroxide (MILK OF MAGNESIA) suspension 30 mL  30 mL Oral Daily PRN Nicholaus Bloom, MD      . metoprolol (LOPRESSOR) tablet 50 mg  50 mg Oral BID Nicholaus Bloom, MD   50 mg at 05/22/15 0768  . zolpidem (AMBIEN) tablet 10 mg  10 mg Oral QHS Encarnacion Slates, NP       PTA Medications: Prescriptions prior to admission  Medication Sig Dispense Refill Last Dose  . ALPRAZolam (XANAX) 0.5 MG tablet take 1 tablet by mouth at bedtime if needed for insomnia  0 05/20/2015 at Unknown time  .  ibuprofen (ADVIL,MOTRIN) 600 MG tablet Take 1 tablet (600 mg total) by mouth every 6 (six) hours as needed (mild pain). 30 tablet 0 Past Month at Unknown time  . levocetirizine (XYZAL) 5 MG tablet Take 5 mg by mouth daily.    05/21/2015 at Unknown time  . Melatonin 3 MG TABS Take 6 mg by mouth at bedtime as needed (sleep).   Past Week at Unknown time  . metoprolol (LOPRESSOR) 50 MG tablet Take 50 mg by mouth 2 (two) times daily.   05/21/2015 at Unknown time  . nortriptyline (PAMELOR) 25 MG capsule Take 1 capsule at bedtime  0 05/20/2015 at Unknown time  . Eszopiclone 3 MG TABS take 1 tablet by mouth at bedtime if needed for sleep  0 Unknown at Unknown time  . ondansetron (ZOFRAN) 4 MG tablet Take 1 tablet (4 mg total) by mouth every 8 (eight) hours as needed for nausea. (Patient not taking: Reported on 05/21/2015) 20 tablet 0 Unknown at Unknown time  . OVER THE COUNTER MEDICATION Take 1 tablet by mouth daily. Calcium, Magnesium, Zinc, Vit D supplement   Unknown at Unknown time  . oxyCODONE-acetaminophen (PERCOCET/ROXICET) 5-325 MG per tablet Take 1-2 tablets by mouth every 4 (four) hours  as needed. (Patient not taking: Reported on 05/21/2015) 30 tablet 0 Unknown at Unknown time   Musculoskeletal: Strength & Muscle Tone: within normal limits Gait & Station: normal Patient leans: N/A  Psychiatric Specialty Exam: Physical Exam  Constitutional: She is oriented to person, place, and time. She appears well-developed.  HENT:  Head: Normocephalic.  Eyes: Pupils are equal, round, and reactive to light.  Neck: Normal range of motion.  Cardiovascular:  Elevated blood pressure, Hx: HTN  Respiratory: Effort normal.  GI: Soft.  Genitourinary:  Denies any issues in this area   Musculoskeletal: Normal range of motion.  Neurological: She is alert and oriented to person, place, and time.  Skin: Skin is warm and dry.  Psychiatric: Her speech is normal. Judgment and thought content normal. Her mood  appears anxious. Her affect is not angry, not blunt, not labile and not inappropriate. She is agitated. Cognition and memory are impaired. She exhibits a depressed mood.    Review of Systems  Constitutional: Positive for weight loss and malaise/fatigue.  Eyes: Positive for photophobia.  Respiratory: Negative.   Cardiovascular: Negative for chest pain, palpitations, orthopnea, claudication, leg swelling and PND.       Hx: HTN  Gastrointestinal: Negative.   Genitourinary: Negative.   Musculoskeletal: Negative.   Skin: Negative.   Neurological: Positive for dizziness, weakness and headaches.  Endo/Heme/Allergies: Negative.   Psychiatric/Behavioral: Positive for depression and memory loss. Negative for suicidal ideas, hallucinations and substance abuse. The patient is nervous/anxious and has insomnia.     Blood pressure 158/80, pulse 105, temperature 98.7 F (37.1 C), temperature source Oral, resp. rate 16, height 5' 2"  (1.575 m), weight 63.504 kg (140 lb).Body mass index is 25.6 kg/(m^2).  General Appearance: Casual  Eye Contact::  Good  Speech:  Clear and Coherent  Volume:  Normal  Mood:  Depressed and Irritable  Affect:  Restricted  Thought Process:  Coherent  Orientation:  Full (Time, Place, and Person)  Thought Content:  Rumination and denies any hallucinations/delusions  Suicidal Thoughts:  No  Homicidal Thoughts:  No  Memory:  Immediate;   Fair Recent;   Fair Remote;   Fair  Judgement:  Intact  Insight:  Present  Psychomotor Activity:  Decreased  Concentration:  Fair  Recall:  [employer] of Knowledge:Fair  Language: Good  Akathisia:  No  Handed:  Right  AIMS (if indicated):     Assets:  Communication Skills Desire for Improvement Social Support  ADL's:  Intact  Cognition: Impaired,  Mild  Sleep:  Number of Hours: 3   Treatment Plan Summary/Recommendations: 1. Admit for crisis management and stabilization, estimated length of stay 3-5 days.  2. Medication  management to reduce current symptoms to base line and improve the patient's overall level of functioning; Initiate Ambien 10 mg for insomnia, Discontinue amitriptyline  3. Treat health problems as indicated: Resume Metoprolol 50 mg for HTN.  4. Develop treatment plan to decrease the need for readmission.  5. Psycho-social education regarding self care.  6. Health care follow up as needed for medical problems.  7. Review, reconcile, and reinstate any pertinent home medications for other health issues where appropriate. 8. Call for consults with hospitalist for any additional specialty patient care services as needed.  Observation Level/Precautions:  15 minute checks  Laboratory:  Per ED  Psychotherapy: Group sessions   Medications: Initiated Ambien 10 mg for insomnia  Consultations: As needed  Discharge Concerns: Mood stability, sleep restoration  Estimated LOS: 3-5 days  Other:  I certify that inpatient services furnished can reasonably be expected to improve the patient's condition.   Encarnacion Slates, PMHNP, FNP-BC 11/26/201610:49 AM I personally assessed the patient, reviewed the physical exam and labs and formulated the treatment plan Geralyn Flash A. Sabra Heck, M.D.

## 2015-05-22 NOTE — Progress Notes (Signed)
Admission Note  59 y/o Pt is alert and oriented x4. Pt admitted on the [facility] I/P adult unit complaining of severe depression, anxiety and insomnia. She states, "I have been feeling very depressed and unable to sleep since my Elavil was changed; this has also increased my anxiety too." Pt at the time of assessment denies SI and HI. Pt also denies pain and AVH. Pt remained calm and cooperative through the shift assessment. Support, encouragement, and safe environment provided. 15 min safety checks initiated and continued for Pt's safety.

## 2015-05-22 NOTE — Tx Team (Addendum)
Initial Interdisciplinary Treatment Plan   PATIENT STRESSORS: Health problems Medication change or noncompliance   PATIENT STRENGTHS: Capable of independent living Warehouse manager means Supportive family/friends   PROBLEM LIST: Problem List/Patient Goals Date to be addressed Date deferred Reason deferred Estimated date of resolution  "I want to be able to sleep" 05/21/15     "I want to be able to function in life" 05/21/15     Anxiety 05/21/15     Depression 05/21/15     [place]ist for Suicide 05/21/15                              DISCHARGE CRITERIA:  Ability to meet basic life and health needs Reduction of life-threatening or endangering symptoms to within safe limits Verbal commitment to aftercare and medication compliance  PRELIMINARY DISCHARGE PLAN: Return to previous living arrangement  PATIENT/FAMIILY INVOLVEMENT: This treatment plan has been presented to and reviewed with the patient, Megan [name], and/or family member.  The patient and family have been given the opportunity to ask questions and make suggestions.  Adediran T Onagoruwa 05/22/2015, 12:43 AM

## 2015-05-22 NOTE — Progress Notes (Signed)
[name] cont to be anxious, worried about her blood pressure ( despite it coming down from the morning reading)  And anxious about " sleeping tonight". Her worries are shared with her physician and she is scheduled to get 50 mg of Pamelor ( she refused to have it increased because she said " I don't want to make more than one change a day...swelling of the ankles I can figure out if it is not a positive change for me". And seroquel is added to her list..for help with her insomnia.   A She completed her daily assessment  And on it she wrote she denies SI and she rated her depression , hopelessness and anxiety " 1/2/3", respectively.   R Safety is in place.

## 2015-05-22 NOTE — BHH Group Notes (Signed)
[place] Group Notes:  (Nursing/MHT/Case Management/Adjunct)  Date:  05/22/2015  Time:  11:00 AM  Type of Therapy:  Psychoeducational Skills  Participation Level:  Active  Participation Quality:  Appropriate  Affect:  Appropriate  Cognitive:  Appropriate  Insight:  Appropriate  Engagement in Group:  Engaged  Modes of Intervention:  Discussion  Summary of Progress/Problems: Pt did attend self inventory group.   Benancio Deeds Shanta 05/22/2015, 11:00 AM

## 2015-05-22 NOTE — BHH Suicide Risk Assessment (Signed)
[facility] Admission Suicide Risk Assessment   Nursing information obtained from:  Patient Demographic factors:  Caucasian Current Mental Status:  NA Loss Factors:  Decline in physical health Historical Factors:  NA Risk Reduction Factors:  Living with another person, especially a relative, Positive social support Total Time spent with patient: 45 minutes Principal Problem: <principal problem not specified> Diagnosis:   Patient Active Problem List   Diagnosis Date Noted  . MDD (major depressive disorder) ([place]) [F32.9] 05/21/2015  . Urinary incontinence [R32] 08/07/2012  . Pelvic relaxation [N81.89] 08/07/2012  . Fibroids [D25.9] 08/07/2012     Continued Clinical Symptoms:  Alcohol Use Disorder Identification Test Final Score (AUDIT): 1 The "Alcohol Use Disorders Identification Test", Guidelines for Use in Primary Care, Second Edition.  World Pharmacologist [facility]). Score between 0-7:  no or low risk or alcohol related problems. Score between 8-15:  moderate risk of alcohol related problems. Score between 16-19:  high risk of alcohol related problems. Score 20 or above:  warrants further diagnostic evaluation for alcohol dependence and treatment.   CLINICAL FACTORS:   Depression:   Insomnia   Musculoskeletal: Strength & Muscle Tone: within normal limits Gait & Station: normal Patient leans: normal  Psychiatric Specialty Exam: Physical Exam  Review of Systems  Constitutional: Positive for malaise/fatigue.  HENT:       Pressure  Eyes: Negative.   Respiratory: Negative.   Cardiovascular: Negative.   Gastrointestinal: Positive for nausea.  Genitourinary: Negative.   Musculoskeletal: Negative.   Skin: Negative.   Neurological: Positive for weakness and headaches.  Endo/Heme/Allergies: Negative.   Psychiatric/Behavioral: Positive for depression. The patient has insomnia.   All other systems reviewed and are negative.   Blood pressure 158/80, pulse 105, temperature 98.7  F (37.1 C), temperature source Oral, resp. rate 16, height 5\' 2"  (1.575 m), weight 63.504 kg (140 lb).Body mass index is 25.6 kg/(m^2).  General Appearance: Fairly Groomed  Engineer, water::  Fair  Speech:  Clear and Coherent  Volume:  fluctuates  Mood:  Anxious, Depressed and worried  Affect:  anxious worried  Thought Process:  Coherent and Goal Directed  Orientation:  Full (Time, Place, and Person)  Thought Content:  symptoms events worries concerns  Suicidal Thoughts:  No  Homicidal Thoughts:  No  Memory:  Immediate;   Fair Recent;   Fair Remote;   Fair  Judgement:  Fair  Insight:  Present  Psychomotor Activity:  Restlessness  Concentration:  Fair  Recall:  [employer] of Knowledge:Fair  Language: Fair  Akathisia:  No  Handed:  Right  AIMS (if indicated):     Assets:  Desire for Improvement Housing Talents/Skills Transportation Vocational/Educational  Sleep:  Number of Hours: 3  Cognition: WNL  ADL's:  Intact     COGNITIVE FEATURES THAT CONTRIBUTE TO RISK:  Closed-mindedness, Polarized thinking and Thought constriction (tunnel vision)   Took Elavil for 20 years. States it was affecting her memory. Sept 15 came off the Elavil. Saw Dr. Clovis Pu. She was placed on Remeron 30 mg HS. States it was not working so he added Trazodone. Had bad side effects. She was having runny nose. Went down the Remeron down to 7.5 then up to 15. States she lost a lot of weight. States she started getting depressed on it. She was switched to [employer]. Was given Xanax 0.5 mg #20. States this week Thanksgiving she was taken to sister PCP she was given [place]. States that the insomnia is affecting her ability to function, getting her  more depressed. States she goes to bed and starts worrying ruminating. She was last switched to nortriptyline that she is to increase to 75 mg Family history mother depression and anxiety. Lives with her husband 30 years in March, 3 children 27 25 24. 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  SUICIDE RISK:   Minimal: No identifiable suicidal ideation.  Patients presenting with no risk factors but with morbid ruminations; may be classified as minimal risk based on the severity of the depressive symptoms  PLAN OF CARE: Supportive approach/coping skills Depression; will continue the pamelor and increase the dose according to Dr. Casimiro Needle plan Insomnia with bedtime ruminations; trial with Seroquel 50 mg HS Will use CBT/mindfulness  Medical Decision Making:  Review of Psycho-Social Stressors (1), Review or order clinical lab tests (1), Review of Medication Regimen & Side Effects (2) and Review of New Medication or Change in Dosage (2)  I certify that inpatient services furnished can reasonably be expected to improve the patient's condition.   LUGO,IRVING A 05/22/2015, 11:05 AM

## 2015-05-23 DIAGNOSIS — F419 Anxiety disorder, unspecified: Secondary | ICD-10-CM

## 2015-05-23 DIAGNOSIS — F5105 Insomnia due to other mental disorder: Secondary | ICD-10-CM

## 2015-05-23 MED ORDER — DIVALPROEX SODIUM 250 MG PO DR TAB
250.0000 mg | DELAYED_RELEASE_TABLET | Freq: Every day | ORAL | Status: DC
  Administered 2015-05-23: 250 mg via ORAL
  Filled 2015-05-23 (×3): qty 1

## 2015-05-23 MED ORDER — ENSURE ENLIVE PO LIQD: 237.0000 mL | ORAL | Status: DC | PRN

## 2015-05-23 NOTE — Plan of Care (Signed)
Problem: Diagnosis: Increased Risk For Suicide Attempt Goal: STG-Patient Will Comply With Medication Regime Outcome: Progressing Patient was compliant with scheduled medications.

## 2015-05-23 NOTE — Progress Notes (Signed)
Writer spoke with patient earlier at medication window and she is concerned that she will not be able to sleep tonight. Writer encouraged her to try and think positive in hopes that her medications will work. She reported later that she feel that she may need to transfer to another hospital that is quieter and beds a little more comfortable. Writer encouraged her to speak with her doctor concerning these matters.  She has been isolative to her room most of the evening and she reports that it was d/t the loud conversations and peers in the dayroom. Support given and she was compliant with scheduled medications and requested prn also to aid in sleep. Safety maintained on unit with 15 min checks.

## 2015-05-23 NOTE — BHH Group Notes (Signed)
[place] Group Notes:  (Nursing/MHT/Case Management/Adjunct)  Date:  05/23/2015  Time:  11:13 AM  Type of Therapy:  Psychoeducational Skills  Participation Level:  Active  Participation Quality:  Appropriate  Affect:  Appropriate  Cognitive:  Appropriate  Insight:  Appropriate  Engagement in Group:  Engaged  Modes of Intervention:  Discussion  Summary of Progress/Problems: Pt did attend self inventory group.  Megan [name] Shanta 05/23/2015, 11:13 AM

## 2015-05-23 NOTE — Plan of Care (Signed)
Problem: Diagnosis: Increased Risk For Suicide Attempt Goal: STG-Patient Will Attend All Groups On The Unit Outcome: Not Progressing Patient did not attend wrap up group this evening on 05/22/15.

## 2015-05-23 NOTE — Progress Notes (Signed)
NUTRITION ASSESSMENT  Pt identified as at risk on the Malnutrition Screen Tool  INTERVENTION: 1. Supplements: Ensure Enlive po PRN, each supplement provides 350 kcal and 20 grams of protein  NUTRITION DIAGNOSIS: Unintentional weight loss related to sub-optimal intake as evidenced by pt report.   Goal: Pt to meet >/= 90% of their estimated nutrition needs.  Monitor:  PO intake  Assessment:  Pt admitted with depression, anxiety and insomnia. Pt with reported weight loss and decreased appetite. Pt has had insignificant weight loss since 2014. RD to order Ensure supplements PRN if patient has poor PO intake during admission.  Height: Ht Readings from Last 1 Encounters:  05/21/15 5\' 2"  (1.575 m)    Weight: Wt Readings from Last 1 Encounters:  05/21/15 140 lb (63.504 kg)    Weight Hx: Wt Readings from Last 10 Encounters:  05/21/15 140 lb (63.504 kg)  11/06/12 156 lb (70.761 kg)  10/28/12 158 lb (71.668 kg)  09/23/12 156 lb (70.761 kg)  08/23/12 154 lb (69.854 kg)  08/07/12 155 lb (70.308 kg)  07/16/12 154 lb (69.854 kg)    BMI:  Body mass index is 25.6 kg/(m^2). Pt meets criteria for overweight based on current BMI.  Estimated Nutritional Needs: Kcal: 25-30 kcal/kg Protein: > 1 gram protein/kg Fluid: 1 ml/kcal  Diet Order: Diet regular Room service appropriate?: Yes; Fluid consistency:: Thin Pt is also offered choice of unit snacks mid-morning and mid-afternoon.  Pt is eating as desired.   Lab results and medications reviewed.   Clayton Bibles, MS, RD, LDN Pager: [phone] After Hours Pager: [phone]

## 2015-05-23 NOTE — BHH Group Notes (Signed)
[place] Group Notes:  (Clinical Social Work)   05/23/2015 1:15-2:15PM  Summary of Progress/Problems:   The main focus of today's process group was to   1)  discuss the importance of adding supports  2)  define health supports versus unhealthy supports  3)  identify the patient's current unhealthy supports and plan how to handle them  4)  Identify the patient's current healthy supports and plan what to add.  An emphasis was placed on using counselor, doctor, therapy groups, 12-step groups, and problem-specific support groups to expand supports.    The patient expressed full comprehension of the concepts presented, and agreed that there is a need to add more supports.  The patient stated she has good supports.  She wants to consider taking her sister with her to doctor appointments.  She was very supportive of another young patient who was not too sure of himself socially.  Type of Therapy:  Process Group with Motivational Interviewing  Participation Level:  Active  Participation Quality:  Attentive and Supportive  Affect:  Blunted and Depressed  Cognitive:  Alert and Appropriate  Insight:  Engaged  Engagement in Therapy:  Engaged  Modes of Intervention:   Education, Psychiatric nurse, Activity  [employer], LCSW .today  5:12 PM

## 2015-05-23 NOTE — Progress Notes (Signed)
[facility] MD Progress Note  05/23/2015 3:28 PM Megan [name]  MRN:  [id number]  Subjective: Megan [name] reports, "My medicine did not help me sleep last night. The Seroquel only relaxed my muscles, but did not sedate me or help my racing thoughts. I cannot go another night without sleeping. I need help. The beds are not comfortable. The pillows are flat. Can I have my own pillows from my bed to sleep. The techs keep checking in on me at night, startling me. I did not get any rest. My sister who is a Education officer, museum is thinking that I may have bipolar disorder. I need help. When can I get discharged?"  Objective:  Patient is seen and chart is reviewed. Patient continues to endorse ongoing insomnia/anxiety, less depressive symptoms, panic attacks, increased worrying & restlessness. She is rating her anxiety and  depression at 8/10 today. However, she denies any suicidal/homicidal thoughts auditory/visual hallucinations. Patient is requesting for her medications to be adjusted to address her current symptoms. She is compliant with her current medications and has not verbalized any adverse reactions/effects. She is currently indecisive whether to continue Seroquel or not. She has an order to use her own pillows to sleep. New medication order (Depakote DR 250 mg for mood stabilization).  Principal Problem: Insomnia secondary to depression with anxiety  Diagnosis:   Patient Active Problem List   Diagnosis Date Noted  . 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 GAD (generalized anxiety disorder) [F41.1] 05/22/2015  . MDD (major depressive disorder) ([place]) [F32.9] 05/21/2015  . Urinary incontinence [R32] 08/07/2012  . Pelvic relaxation [N81.89] 08/07/2012  . Fibroids [D25.9] 08/07/2012   Total Time spent with patient: 35 minutes  Past Psychiatric History: Depression/insomnia  Past Medical History:  Past Medical History  Diagnosis Date  . Hypertension   . Insomnia 1995  .  Urticaria   . Incontinence   . Arthritis   . Asthma     no inhaler use "for months"  . GERD (gastroesophageal reflux disease)     Past Surgical History  Procedure Laterality Date  . Orthoscopic knee surgery  2009, 2011    x2  . Diagnostic laparoscopy    . Laparoscopic hysterectomy N/A 11/06/2012    Procedure: HYSTERECTOMY TOTAL LAPAROSCOPIC;  Surgeon: Delice Lesch, MD;  Location: [facility];  Service: Gynecology;  Laterality: N/A;  . Anterior and posterior repair N/A 11/06/2012    Procedure: ANTERIOR (CYSTOCELE) AND POSTERIOR REPAIR (RECTOCELE);  Surgeon: Delice Lesch, MD;  Location: [facility];  Service: Gynecology;  Laterality: N/A;  . Bladder suspension N/A 11/06/2012    Procedure: TRANSVAGINAL TAPE (TVT) PROCEDURE;  Surgeon: Delice Lesch, MD;  Location: [facility];  Service: Gynecology;  Laterality: N/A;  . Unilateral salpingectomy Right 11/06/2012    Procedure: UNILATERAL SALPINGECTOMY;  Surgeon: Delice Lesch, MD;  Location: [facility];  Service: Gynecology;  Laterality: Right;  . Cystoscopy N/A 11/06/2012    Procedure: CYSTOSCOPY;  Surgeon: Delice Lesch, MD;  Location: [facility];  Service: Gynecology;  Laterality: N/A;   Family History:  Family History  Problem Relation Age of Onset  . Hypertension Father   . Arthritis Father   . Glaucoma Father   . Cancer Mother    Family Psychiatric  History: Depression: Mother  Social History:  History  Alcohol Use No     History  Drug Use No    Social History   Social History  . Marital Status: Married  Spouse Name: N/A  . Number of Children: N/A  . Years of Education: N/A   Social History Main Topics  . Smoking status: Never Smoker   . Smokeless tobacco: None  . Alcohol Use: No  . Drug Use: No  . Sexual Activity: Yes    Birth Control/ Protection: Post-menopausal   Other Topics Concern  . None   Social History Narrative   Additional Social History:    Pain Medications: See PTA list Prescriptions: See PTA  list Over the Counter: See PTA list History of alcohol / drug use?: No history of alcohol / drug abuse  Sleep: " Poor"  Appetite:  Fair  Current Medications: Current Facility-Administered Medications  Medication Dose Route Frequency Provider Last Rate Last Dose  . acetaminophen (TYLENOL) tablet 650 mg  650 mg Oral Q6H PRN Nicholaus Bloom, MD      . ALPRAZolam Duanne Moron) tablet 0.5 mg  0.5 mg Oral QHS PRN Nicholaus Bloom, MD   0.5 mg at 05/23/15 0021  . alum & mag hydroxide-simeth (MAALOX/MYLANTA) 200-200-20 MG/5ML suspension 30 mL  30 mL Oral Q4H PRN Nicholaus Bloom, MD      . divalproex (DEPAKOTE) DR tablet 250 mg  250 mg Oral QHS Encarnacion Slates, NP      . feeding supplement (ENSURE ENLIVE) (ENSURE ENLIVE) liquid 237 mL  237 mL Oral PRN Clayton Bibles, RD      . hydrOXYzine (ATARAX/VISTARIL) tablet 50 mg  50 mg Oral Q6H PRN Nicholaus Bloom, MD   50 mg at 05/22/15 2312  . magnesium hydroxide (MILK OF MAGNESIA) suspension 30 mL  30 mL Oral Daily PRN Nicholaus Bloom, MD      . metoprolol (LOPRESSOR) tablet 50 mg  50 mg Oral BID Nicholaus Bloom, MD   50 mg at 05/23/15 1000  . nortriptyline (PAMELOR) capsule 50 mg  50 mg Oral QHS Nicholaus Bloom, MD   50 mg at 05/22/15 2030  . QUEtiapine (SEROQUEL) tablet 50 mg  50 mg Oral QHS Nicholaus Bloom, MD   50 mg at 05/22/15 2030   Lab Results:  Results for orders placed or performed during the hospital encounter of 05/21/15 (from the past 48 hour(s))  Comprehensive metabolic panel     Status: Abnormal   Collection Time: 05/21/15  5:47 PM  Result Value Ref Range   Sodium 137 135 - 145 mmol/L   Potassium 4.0 3.5 - 5.1 mmol/L   Chloride 102 101 - 111 mmol/L   CO2 30 22 - 32 mmol/L   Glucose, Bld 104 (H) 65 - 99 mg/dL   BUN 14 6 - 20 mg/dL   Creatinine, Ser 0.75 0.44 - 1.00 mg/dL   Calcium 9.4 8.9 - 10.3 mg/dL   Total Protein 7.8 6.5 - 8.1 g/dL   Albumin 4.7 3.5 - 5.0 g/dL   AST 20 15 - 41 U/L   ALT 23 14 - 54 U/L   Alkaline Phosphatase 81 38 - 126 U/L   Total  Bilirubin 0.5 0.3 - 1.2 mg/dL   GFR calc non Af Amer >60 >60 mL/min   GFR calc Af Amer >60 >60 mL/min    Comment: (NOTE) The eGFR has been calculated using the CKD EPI equation. This calculation has not been validated in all clinical situations. eGFR's persistently <60 mL/min signify possible Chronic Kidney Disease.    Anion gap 5 5 - 15  Ethanol (ETOH)     Status: None   Collection Time:  05/21/15  5:47 PM  Result Value Ref Range   Alcohol, Ethyl (B) <5 <5 mg/dL    Comment:        LOWEST DETECTABLE LIMIT FOR SERUM ALCOHOL IS 5 mg/dL FOR MEDICAL PURPOSES ONLY   Salicylate level     Status: None   Collection Time: 05/21/15  5:47 PM  Result Value Ref Range   Salicylate Lvl <4.6 2.8 - 30.0 mg/dL  Acetaminophen level     Status: Abnormal   Collection Time: 05/21/15  5:47 PM  Result Value Ref Range   Acetaminophen (Tylenol), Serum <10 (L) 10 - 30 ug/mL    Comment:        THERAPEUTIC CONCENTRATIONS VARY SIGNIFICANTLY. A RANGE OF 10-30 ug/mL MAY BE AN EFFECTIVE CONCENTRATION FOR MANY PATIENTS. HOWEVER, SOME ARE BEST TREATED AT CONCENTRATIONS OUTSIDE THIS RANGE. ACETAMINOPHEN CONCENTRATIONS >150 ug/mL AT 4 HOURS AFTER INGESTION AND >50 ug/mL AT 12 HOURS AFTER INGESTION ARE OFTEN ASSOCIATED WITH TOXIC REACTIONS.   CBC     Status: Abnormal   Collection Time: 05/21/15  5:47 PM  Result Value Ref Range   WBC 10.4 4.0 - 10.5 K/uL   RBC 5.07 3.87 - 5.11 MIL/uL   Hemoglobin 15.0 12.0 - 15.0 g/dL   HCT 45.5 36.0 - 46.0 %   MCV 89.7 78.0 - 100.0 fL   MCH 29.6 26.0 - 34.0 pg   MCHC 33.0 30.0 - 36.0 g/dL   RDW 12.4 11.5 - 15.5 %   Platelets 443 (H) 150 - 400 K/uL  Urine rapid drug screen (hosp performed) (Not at [facility])     Status: None   Collection Time: 05/21/15  5:47 PM  Result Value Ref Range   Opiates NONE DETECTED NONE DETECTED   Cocaine NONE DETECTED NONE DETECTED   Benzodiazepines NONE DETECTED NONE DETECTED   Amphetamines NONE DETECTED NONE DETECTED    Tetrahydrocannabinol NONE DETECTED NONE DETECTED   Barbiturates NONE DETECTED NONE DETECTED    Comment:        DRUG SCREEN FOR MEDICAL PURPOSES ONLY.  IF CONFIRMATION IS NEEDED FOR ANY PURPOSE, NOTIFY LAB WITHIN 5 DAYS.        LOWEST DETECTABLE LIMITS FOR URINE DRUG SCREEN Drug Class       Cutoff (ng/mL) Amphetamine      1000 Barbiturate      200 Benzodiazepine   270 Tricyclics       350 Opiates          300 Cocaine          300 THC              50    Physical Findings:  AIMS: Facial and Oral Movements Muscles of Facial Expression: None, normal Lips and Perioral Area: None, normal Jaw: None, normal Tongue: None, normal,Extremity Movements Upper (arms, wrists, hands, fingers): None, normal Lower (legs, knees, ankles, toes): None, normal, Trunk Movements Neck, shoulders, hips: None, normal, Overall Severity Severity of abnormal movements (highest score from questions above): None, normal Incapacitation due to abnormal movements: None, normal Patient's awareness of abnormal movements (rate only patient's report): No Awareness, Dental Status Current problems with teeth and/or dentures?: No Does patient usually wear dentures?: No  CIWA:  CIWA-Ar Total: 2 COWS:  COWS Total Score: 4  Musculoskeletal: Strength & Muscle Tone: within normal limits Gait & Station: normal Patient leans: N/A  Psychiatric Specialty Exam: Review of Systems  Constitutional: Negative.   HENT: Negative.   Eyes: Negative.   Respiratory: Negative.  Cardiovascular: Negative.   Gastrointestinal: Negative.   Genitourinary: Negative.   Musculoskeletal: Negative.   Skin: Negative.   Neurological: Negative.   Endo/Heme/Allergies: Negative.   Psychiatric/Behavioral: Positive for depression. Negative for suicidal ideas, hallucinations, memory loss and substance abuse. The patient is nervous/anxious and has insomnia.     Blood pressure 117/72, pulse 97, temperature 98.4 F (36.9 C), temperature source  Oral, resp. rate 16, height _0  (1.575 m), weight 63.504 kg (140 lb).Body mass index is 25.6 kg/(m^2).  General Appearance: Casual  Eye Contact::  Good  Speech:  Clear and Coherent  Volume:  Normal  Mood:  Anxious, Hopeless and worrying  Affect:  Constricted  Thought Process:  Circumstantial and Tangential  Orientation:  Full (Time, Place, and Person)  Thought Content:  Rumination  Suicidal Thoughts:  No  Homicidal Thoughts:  No  Memory:  Intact, although says Elavil affected her memory negatively.  Judgement:  Fair  Insight:  Present  Psychomotor Activity:  Restlessness  Concentration:  Poor  Recall:  Good  Fund of Knowledge:Poor  Language: Good  Akathisia:  No  Handed:  Right  AIMS (if indicated):     Assets:  Communication Skills Desire for Improvement  ADL's:  Intact  Cognition: WNL  Sleep:  Number of Hours: 6   Treatment Plan Summary: 1. Continue crisis management, mood stabilization. 2. Continue current medication management to reduce current symptoms to base line and improve the  patient's overall level of functioning; Continue Nortriptyline 50 mg for depression/insomnia, continue Seroquel 50 mg Q bedtime for mood control, Alprazolam 0.5 mg for anxiety prn, add Depakote DR 25 mg for mood stabilization, obtain Depakote levels on 05-26-15 at 05:00 am,  3. Treat health problems as indicated; continue Metoprolol 50 mg for HTN. 4. Develop treatment plan to enhance medication adeherance upon discharge and  Prevent the need for  readmission. 5. Psycho-social education regarding relapse prevention and self care. 6. May use own personal pillow to sleep.  Encarnacion Slates, PMHNP, FNP-BC 05/23/2015, 3:28 PM I agree with assessment and plan Geralyn Flash A. Sabra Heck, M.D.

## 2015-05-23 NOTE — Progress Notes (Signed)
D Kaleia remains guarded, anxious and worried about her blood pressure. She talks about wanting to be dc'd home tomorrow and able to say " I don't really think  Im ready.I just miss my home..."   A Kennah completed her assessment and on it she wrote she deneid SI and she rated her depression, hopelessness and anxiety " 5/5/4", respectively.   R Per NP order, pt given bed pillows to facilitate sleep ( insomnia cont).

## 2015-05-23 NOTE — Progress Notes (Signed)
Patient reports that she has had a good day and was glad to visit with her husband tonight. She was aware that depakote was added to her medications but did not want to take the seroquel tonight. Writer explained to her about how the medication was ordered and the times but she reported that she was told not to take the seroquel with the depakote ordered. Writer encouraged her to clarify this with the doctor on tomorrow since she felt that she was not to take the medications as ordered. She denies si/hi/a/v hallucinations.

## 2015-05-24 MED ORDER — QUETIAPINE FUMARATE 100 MG PO TABS
100.0000 mg | ORAL_TABLET | Freq: Every day | ORAL | Status: DC
  Administered 2015-05-24: 100 mg via ORAL
  Filled 2015-05-24 (×3): qty 1

## 2015-05-24 MED ORDER — LORAZEPAM 0.5 MG PO TABS: 0.5000 mg | ORAL_TABLET | Freq: Four times a day (QID) | ORAL | Status: DC | PRN

## 2015-05-24 MED ORDER — NORTRIPTYLINE HCL 25 MG PO CAPS
75.0000 mg | ORAL_CAPSULE | Freq: Every day | ORAL | Status: DC
  Administered 2015-05-24 – 2015-05-25 (×2): 75 mg via ORAL
  Filled 2015-05-24 (×4): qty 3

## 2015-05-24 MED ORDER — LORAZEPAM 1 MG PO TABS
1.0000 mg | ORAL_TABLET | Freq: Once | ORAL | Status: AC
  Administered 2015-05-24: 1 mg via ORAL
  Filled 2015-05-24: qty 1

## 2015-05-24 NOTE — Progress Notes (Signed)
D: Patient upset because her DNA order was lifted today.  Patient slept 6.5 hours last night and DNA was not renewed.  Patient states that is why her blood pressure has been high.  She appears anxious and fidgety.  She hopes to be discharged tomorrow.  She denies any depressive symptoms or on-going anxiety.  She denies SI/HI/AVH.  She states, "I get the anxiety/tension headaches when I'm trying to fall asleep.  I'm concerned I'm now dependent on xanax."  A: Continue to monitor medication management and MD orders.  Safety checks completed every 15 minutes per protocol.  Offer support and encouragement as needed. R: Patient is calm and cooperative.

## 2015-05-24 NOTE — Tx Team (Signed)
Interdisciplinary Treatment Plan Update (Adult) Date: 05/24/2015   Date: 05/24/2015 4:12 PM  Progress in Treatment:  Attending groups: Yes  Participating in groups: Minimally Taking medication as prescribed: Yes  Tolerating medication: Yes  Family/Significant othe contact made: No, CSW attempting to make contact with husband Patient understands diagnosis: Yes Discussing patient identified problems/goals with staff: Yes  Medical problems stabilized or resolved: Yes  Denies suicidal/homicidal ideation: Pt endorses passive SI Patient has not harmed self or Others: Yes   New problem(s) identified: None identified at this time.   Discharge Plan or Barriers: Pt will discharge home and follow-up with outpatient resources  Additional comments: n/a   Reason for Continuation of Hospitalization:  Anxiety Depression Medication stabilization Suicidal ideation  Estimated length of stay: 2-3 days  Review of initial/current patient goals per problem list:   1.  Goal(s): Patient will participate in aftercare plan  Met:  Yes  Target date: 3-5 days from date of admission   As evidenced by: Patient will participate within aftercare plan AEB aftercare provider and housing plan at discharge being identified.   05/24/15: Pt will discharge home and follow-up with outpatient providers  2.  Goal (s): Patient will exhibit decreased depressive symptoms and suicidal ideations.  Met:  No  Target date: 3-5 days from date of admission   As evidenced by: Patient will utilize self rating of depression at 3 or below and demonstrate decreased signs of depression or be deemed stable for discharge by MD.  05/24/15: Pt rates depression at 0/10; however endorses passive SI  3.  Goal(s): Patient will demonstrate decreased signs and symptoms of anxiety.  Met:  Yes  Target date: 3-5 days from date of admission   As evidenced by: Patient will utilize self rating of anxiety at 3 or below and  demonstrated decreased signs of anxiety, or be deemed stable for discharge by MD  05/24/15: Pt rates anxiety at 3/10, however is observed to be withdrawn  Attendees:  Patient:    Family:    Physician: Dr. Parke Poisson, MD  05/24/2015 4:12 PM  Nursing: Lars Pinks, RN Case manager  05/24/2015 4:12 PM  Clinical Social Worker Peri Maris, [place] 05/24/2015 4:12 PM  Other: Tilden Fossa, LCSWA 05/24/2015 4:12 PM  Clinical:  Corky Mull, RN 05/24/2015 4:12 PM  Other: , RN Charge Nurse 05/24/2015 4:12 PM  Other: Hilda Lias, [place], [place] [facility] [phone]

## 2015-05-24 NOTE — Progress Notes (Signed)
Patient ID: Megan [name], female   DOB: 14-Jul-1955, 59 y.o.   MRN: [id number] [facility] MD Progress Note  05/24/2015 12:12 PM Megan [name]  MRN:  [id number]  Subjective: Patient reports some improvement compared to admission . She presents anxious and focused on medication issues.  In particular, she worries about insomnia, which she states is a chronic issue for her, and which she feels worsens her anxiety and depression.   Objective:  Patient is seen and case discussed with treatment team. Patient presents with some anxiety, ruminations, particularly regarding her current medication regimen and her insomnia. States she has chronic insomnia, and has not been sleeping well in spite of current medications, and states she " stays up half the night worrying i will feel terrible the next day because I can't sleep". Has been going to some groups, but participation is limited.  No disruptive behaviors on unit . Patient presents anxious, ruminative- she does improve partially with support and reassurance. We reviewed history - at this time patient reporting history of anxiety, depression, and stresses insomnia as major symptom. She does not endorse any clear episodes of mania/hypomania, and does not appear to correlate insomnia with episodes of increased energy, racing thoughts. She does present with some increased rate of speech, but this seems to be related to anxiety. She was recently started on Xanax for insomnia ( about one week ago ) and worries about it being addictive . We discussed other treatment options. Of note, patient has been on Amitriptyline for many years, and feels it worked well for her , but stopped it due to perceived memory difficulties on it. She is now on Nortriptyline and does not endorse side effects thus far  Principal Problem: Insomnia secondary to depression with anxiety  Diagnosis:   Patient Active Problem List   Diagnosis Date Noted  . 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 GAD (generalized anxiety disorder) [F41.1] 05/22/2015  . MDD (major depressive disorder) ([place]) [F32.9] 05/21/2015  . Urinary incontinence [R32] 08/07/2012  . Pelvic relaxation [N81.89] 08/07/2012  . Fibroids [D25.9] 08/07/2012   Total Time spent with patient:  25 minutes   Past Psychiatric History: Depression/insomnia  Past Medical History:  Past Medical History  Diagnosis Date  . Hypertension   . Insomnia 1995  . Urticaria   . Incontinence   . Arthritis   . Asthma     no inhaler use "for months"  . GERD (gastroesophageal reflux disease)     Past Surgical History  Procedure Laterality Date  . Orthoscopic knee surgery  2009, 2011    x2  . Diagnostic laparoscopy    . Laparoscopic hysterectomy N/A 11/06/2012    Procedure: HYSTERECTOMY TOTAL LAPAROSCOPIC;  Surgeon: Delice Lesch, MD;  Location: [facility];  Service: Gynecology;  Laterality: N/A;  . Anterior and posterior repair N/A 11/06/2012    Procedure: ANTERIOR (CYSTOCELE) AND POSTERIOR REPAIR (RECTOCELE);  Surgeon: Delice Lesch, MD;  Location: [facility];  Service: Gynecology;  Laterality: N/A;  . Bladder suspension N/A 11/06/2012    Procedure: TRANSVAGINAL TAPE (TVT) PROCEDURE;  Surgeon: Delice Lesch, MD;  Location: [facility];  Service: Gynecology;  Laterality: N/A;  . Unilateral salpingectomy Right 11/06/2012    Procedure: UNILATERAL SALPINGECTOMY;  Surgeon: Delice Lesch, MD;  Location: [facility];  Service: Gynecology;  Laterality: Right;  . Cystoscopy N/A 11/06/2012    Procedure: CYSTOSCOPY;  Surgeon: Delice Lesch, MD;  Location: [facility];  Service: Gynecology;  Laterality: N/A;   Family History:  Family History  Problem Relation Age of Onset  . Hypertension Father   . Arthritis Father   . Glaucoma Father   . Cancer Mother    Family Psychiatric  History: Depression: Mother  Social History:  History  Alcohol Use No     History  Drug Use No    Social History   Social  History  . Marital Status: Married    Spouse Name: N/A  . Number of Children: N/A  . Years of Education: N/A   Social History Main Topics  . Smoking status: Never Smoker   . Smokeless tobacco: None  . Alcohol Use: No  . Drug Use: No  . Sexual Activity: Yes    Birth Control/ Protection: Post-menopausal   Other Topics Concern  . None   Social History Narrative   Additional Social History:    Pain Medications: See PTA list Prescriptions: See PTA list Over the Counter: See PTA list History of alcohol / drug use?: No history of alcohol / drug abuse  Sleep: fair   Appetite:  Improved   Current Medications: Current Facility-Administered Medications  Medication Dose Route Frequency Provider Last Rate Last Dose  . acetaminophen (TYLENOL) tablet 650 mg  650 mg Oral Q6H PRN Nicholaus Bloom, MD      . ALPRAZolam Duanne Moron) tablet 0.5 mg  0.5 mg Oral QHS PRN Nicholaus Bloom, MD   0.5 mg at 05/23/15 2347  . alum & mag hydroxide-simeth (MAALOX/MYLANTA) 200-200-20 MG/5ML suspension 30 mL  30 mL Oral Q4H PRN Nicholaus Bloom, MD      . divalproex (DEPAKOTE) DR tablet 250 mg  250 mg Oral QHS Encarnacion Slates, NP   250 mg at 05/23/15 2053  . feeding supplement (ENSURE ENLIVE) (ENSURE ENLIVE) liquid 237 mL  237 mL Oral PRN Clayton Bibles, RD      . hydrOXYzine (ATARAX/VISTARIL) tablet 50 mg  50 mg Oral Q6H PRN Nicholaus Bloom, MD   50 mg at 05/22/15 2312  . magnesium hydroxide (MILK OF MAGNESIA) suspension 30 mL  30 mL Oral Daily PRN Nicholaus Bloom, MD      . metoprolol (LOPRESSOR) tablet 50 mg  50 mg Oral BID Nicholaus Bloom, MD   50 mg at 05/24/15 [phone]  . nortriptyline (PAMELOR) capsule 50 mg  50 mg Oral QHS Nicholaus Bloom, MD   50 mg at 05/23/15 2053  . QUEtiapine (SEROQUEL) tablet 50 mg  50 mg Oral QHS Nicholaus Bloom, MD   50 mg at 05/22/15 2030   Lab Results:  No results found for this or any previous visit (from the past 48 hour(s)). Physical Findings:  AIMS: Facial and Oral Movements Muscles of Facial  Expression: None, normal Lips and Perioral Area: None, normal Jaw: None, normal Tongue: None, normal,Extremity Movements Upper (arms, wrists, hands, fingers): None, normal Lower (legs, knees, ankles, toes): None, normal, Trunk Movements Neck, shoulders, hips: None, normal, Overall Severity Severity of abnormal movements (highest score from questions above): None, normal Incapacitation due to abnormal movements: None, normal Patient's awareness of abnormal movements (rate only patient's report): No Awareness, Dental Status Current problems with teeth and/or dentures?: No Does patient usually wear dentures?: No  CIWA:  CIWA-Ar Total: 2 COWS:  COWS Total Score: 4  Musculoskeletal: Strength & Muscle Tone: within normal limits Gait & Station: normal Patient leans: N/A  Psychiatric Specialty Exam: Review of Systems  Constitutional: Negative.   HENT: Negative.  Eyes: Negative.   Respiratory: Negative.   Cardiovascular: Negative.   Gastrointestinal: Negative.   Genitourinary: Negative.   Musculoskeletal: Negative.   Skin: Negative.   Neurological: Negative.   Endo/Heme/Allergies: Negative.   Psychiatric/Behavioral: Positive for depression. Negative for suicidal ideas, hallucinations, memory loss and substance abuse. The patient is nervous/anxious and has insomnia.     Blood pressure 107/67, pulse 85, temperature 97.5 F (36.4 C), temperature source Oral, resp. rate 18, height 5\' 2"  (1.575 m), weight 140 lb (63.504 kg).Body mass index is 25.6 kg/(m^2).  General Appearance: Casual  Eye Contact::  Good  Speech:  Clear and Coherent  Volume:  Normal  Mood:  Some depression, anxiety  Affect:  Anxious   Thought Process:  Circumstantial and Tangential  Orientation:  Full (Time, Place, and Person)  Thought Content:  Rumination about medication issues and insomnia   Suicidal Thoughts:  No at this time denies plan or intention of hurting self or of SI  Homicidal Thoughts:  No  Memory:   Recent and remote grossly intact   Judgement:  Fair  Insight:  Present  Psychomotor Activity:  Normal   Concentration:  Fair  Recall:  Good  Fund of Knowledge:Good  Language: Good  Akathisia:  No  Handed:  Right  AIMS (if indicated):     Assets:  Communication Skills Desire for Improvement  ADL's:  Intact  Cognition: WNL  Sleep:  Number of Hours: 6.5  Assessment - patient presents  Mostly with anxiety  And anxious ruminations at this time. She reports long history of insomnia. She ruminates about medication issues, but denies medication side effects at present . No clear history of Bipolar Disorder at this time. She states she is tolerating Nortriptyline well and states that she has responded better to tricyclics ( Amytriptyline ) than to SSRIs in the past.  She states she slept fairly in spite of Seroquel at 50 mgrs QHS .   Treatment Plan Summary: 1. Continue crisis management, mood stabilization. Increase Nortriptyline to 75 mg QHS  for depression/anxiety /insomnia Increase Seroquel to 100  mg QHS for mood control D/C Alprazolam  Due to concerns about short half life, early morning awakening and addictive potential D/C Depakote, as history not suggestive of Bipolar Disorder, as per patient's current information. Continue Metoprolol 50 mg QDAY for HTN.   Neita Garnet,  MD  05/24/2015, 12:12 PM

## 2015-05-24 NOTE — BHH Group Notes (Signed)
[facility] LCSW Group Therapy  05/24/2015 1:15pm  Type of Therapy:  Group Therapy vercoming Obstacles  Participation Level:  Minimal  Participation Quality:  Inattentive  Affect:  Flat  Cognitive:  Appropriate and Oriented  Insight:  Developing/Improving and Improving  Engagement in Therapy:  Limited  Modes of Intervention:  Discussion, Exploration, Problem-solving and Support  Description of Group:   In this group patients will be encouraged to explore what they see as obstacles to their own wellness and recovery. They will be guided to discuss their thoughts, feelings, and behaviors related to these obstacles. The group will process together ways to cope with barriers, with attention given to specific choices patients can make. Each patient will be challenged to identify changes they are motivated to make in order to overcome their obstacles. This group will be process-oriented, with patients participating in exploration of their own experiences as well as giving and receiving support and challenge from other group members.  Summary of Patient Progress: Pt attended group but did not participate verbally in discussion. Pt observed to be inattentive, seeming distracted.   Therapeutic Modalities:   Cognitive Behavioral Therapy Solution Focused Therapy Motivational Interviewing Relapse Prevention Therapy   Peri Maris, [place] 05/24/2015 3:39 PM

## 2015-05-24 NOTE — Progress Notes (Signed)
Adult Psychoeducational Group Note  Date:  05/24/2015 Time:  9:57 PM  Group Topic/Focus:  Wrap-Up Group:   The focus of this group is to help patients review their daily goal of treatment and discuss progress on daily workbooks.  Participation Level:  Active  Participation Quality:  Appropriate and Attentive  Affect:  Appropriate  Cognitive:  Appropriate  Insight: Appropriate and Good  Engagement in Group:  Engaged  Modes of Intervention:  Education  Additional Comments:  Pt overall had a good day. Pt goal is to exercise tomorrow to help relieve stress.   Jerline Pain 05/24/2015, 9:57 PM

## 2015-05-24 NOTE — BHH Group Notes (Signed)
[facility] LCSW Aftercare Discharge Planning Group Note  05/24/2015 8:45 AM  Participation Quality: Alert, Appropriate and Oriented   Mood/Affect: Flat  Depression Rating: 0  Anxiety Rating: 3  Thoughts of Suicide: Pt endorses passive SI  Will you contract for safety? Yes  Current AVH: Pt denies  Plan for Discharge/Comments: Pt attended discharge planning group and actively participated in group. CSW discussed suicide prevention education with the group and encouraged them to discuss discharge planning and any relevant barriers. Pt is requesting a new outpatient provider as she feels that her current provider is not supporting her effectively. CSW advised that she would need to pursue that on her own and that CSW would provide a list of other outpatient providers.  Transportation Means: Pt reports access to transportation  Supports: No supports mentioned at this time  Peri Maris, [place] 05/24/2015 10:23 AM

## 2015-05-25 MED ORDER — QUETIAPINE FUMARATE 50 MG PO TABS
75.0000 mg | ORAL_TABLET | Freq: Every day | ORAL | Status: DC
  Administered 2015-05-25: 75 mg via ORAL
  Filled 2015-05-25 (×3): qty 1

## 2015-05-25 NOTE — Progress Notes (Signed)
Recreation Therapy Notes   Animal-Assisted Activity (AAA) Program Checklist/Progress Notes Patient Eligibility Criteria Checklist & Daily Group note for Rec Tx Intervention  Date: 11.29.2016 Time: 2:45pm Location: [address]    AAA/T Program Assumption of Risk Form signed by Patient/ or Parent Legal Guardian yes  Patient is free of allergies or sever asthma yes  Patient reports no fear of animals yes  Patient reports no history of cruelty to animals yes  Patient understands his/her participation is voluntary yes  Patient washes hands before animal contact yes  Patient washes hands after animal contact yes  Behavioral Response: Appropriate   Education: Hand Washing, Appropriate Animal Interaction   Education Outcome: Acknowledges education.   Clinical Observations/Feedback: Patient engaged appropriately with therapy dog and peers in session.    Laureen Ochs [place]anch[place], LRT/CTRS  Blanchfield, Denise L 05/25/2015 3:15 PM

## 2015-05-25 NOTE — BHH Group Notes (Signed)
Adult Psychoeducational Group Note  Date:  05/25/2015 Time:  9:07 PM  Group Topic/Focus:  Wrap-Up Group:   The focus of this group is to help patients review their daily goal of treatment and discuss progress on daily workbooks.  Participation Level:  Minimal  Participation Quality:  Attentive  Affect:  Flat  Cognitive:  Alert  Insight: Good  Engagement in Group:  Limited  Modes of Intervention:  Discussion  Additional Comments:  Patient stated she had a good day.  She expressed that got to do some yoga when she went down to the gym.  She is discharging tomorrow back home.  Megan [name] A 05/25/2015, 9:07 PM

## 2015-05-25 NOTE — Progress Notes (Signed)
D:Affect is flat/sad,mood is depressed. Pt rates her depression and anxiety as a 3-4 respectfully on scale of 10. Attends all groups and activities offered. Reports anxiety related to D/C date and remains focused on when she might leave. A:Support and encouragement offered. R:Receptive. No complaints of pain or problems at this time.

## 2015-05-25 NOTE — BHH Group Notes (Signed)
[facility] LCSW Group Therapy 05/25/2015 1:15 PM  Type of Therapy: Group Therapy- Feelings about Diagnosis  Participation Level: Active   Participation Quality:  Appropriate  Affect:  Appropriate  Cognitive: Alert and Oriented   Insight:  Developing   Engagement in Therapy: Developing/Improving and Engaged   Modes of Intervention: Clarification, Confrontation, Discussion, Education, Exploration, Limit-setting, Orientation, Problem-solving, Rapport Building, Art therapist, Socialization and Support  Description of Group:   This group will allow patients to explore their thoughts and feelings about diagnoses they have received. Patients will be guided to explore their level of understanding and acceptance of these diagnoses. Facilitator will encourage patients to process their thoughts and feelings about the reactions of others to their diagnosis, and will guide patients in identifying ways to discuss their diagnosis with significant others in their lives. This group will be process-oriented, with patients participating in exploration of their own experiences as well as giving and receiving support and challenge from other group members.  Summary of Progress/Problems:  Pt more involved in group discussion today. She discussed the challenge of mental health treatment and how it differs for people. She expressed that she has become frustrated with people who try to impose their treatment regimen on her and when it does not work personally, they attribute it to her not trying hard enough. Pt interested in learning more about depression and CBT for treatment.  Therapeutic Modalities:   Cognitive Behavioral Therapy Solution Focused Therapy Motivational Interviewing Relapse Prevention Therapy  Peri Maris, LCSWA 05/25/2015 3:41 PM

## 2015-05-25 NOTE — Progress Notes (Signed)
D:  Megan [name] reports that her day was ok, but she continues to be anxious and is worried about sleep.  She denies SI/HI/AVH at this time and is interacting appropriately with staff and peers.  She is attending groups without a problem.  A:  Safety checks q 15 minutes.  Emotional support provided.  Medications administered as ordered.  R:  Safety maintained on unit.

## 2015-05-25 NOTE — Progress Notes (Signed)
Patient ID: Megan [name], female   DOB: 1955/08/03, 59 y.o.   MRN: [id number] [facility] MD Progress Note  05/25/2015 3:43 PM WYNELL BRIGANCE  MRN:  [id number]  Subjective:  She states she is starting to feel a little better . She is happy that she slept well, and feels more rested today. She denies side effects at present , but continues to worry and ruminate , particularly about medication issues , including about possible side effects and whether tapering off Nortriptyline would be easy or would it be likely to cause WDL. She states she feels this medication is helping at present and does not want to stop it, but worries about how " difficult it might me to stop it if it causes side effects". States she had good visit from husband yesterday.    Objective:  Patient is seen and case discussed with treatment team. Patient improved compared to admission- a little  less severely anxious, less severely depressed , and reporting improved sleep.  She does continue to  present quite anxious, however. She is  Ruminative and  seeking repeated reassurance and support regarding medications, possible side effects. She does seem to respond better to support and reassurance today, however. She denies any current medication side effects. At this time does not endorse subjective sense of impaired short term memory , which she had reported during a prior Amitriptyline trial.  Principal Problem: Insomnia secondary to depression with anxiety  Diagnosis:   Patient Active Problem List   Diagnosis Date Noted  . 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 GAD (generalized anxiety disorder) [F41.1] 05/22/2015  . MDD (major depressive disorder) ([place]) [F32.9] 05/21/2015  . Urinary incontinence [R32] 08/07/2012  . Pelvic relaxation [N81.89] 08/07/2012  . Fibroids [D25.9] 08/07/2012   Total Time spent with patient:  25 minutes   Past Psychiatric History: Depression/insomnia  Past Medical  History:  Past Medical History  Diagnosis Date  . Hypertension   . Insomnia 1995  . Urticaria   . Incontinence   . Arthritis   . Asthma     no inhaler use "for months"  . GERD (gastroesophageal reflux disease)     Past Surgical History  Procedure Laterality Date  . Orthoscopic knee surgery  2009, 2011    x2  . Diagnostic laparoscopy    . Laparoscopic hysterectomy N/A 11/06/2012    Procedure: HYSTERECTOMY TOTAL LAPAROSCOPIC;  Surgeon: Delice Lesch, MD;  Location: [facility];  Service: Gynecology;  Laterality: N/A;  . Anterior and posterior repair N/A 11/06/2012    Procedure: ANTERIOR (CYSTOCELE) AND POSTERIOR REPAIR (RECTOCELE);  Surgeon: Delice Lesch, MD;  Location: [facility];  Service: Gynecology;  Laterality: N/A;  . Bladder suspension N/A 11/06/2012    Procedure: TRANSVAGINAL TAPE (TVT) PROCEDURE;  Surgeon: Delice Lesch, MD;  Location: [facility];  Service: Gynecology;  Laterality: N/A;  . Unilateral salpingectomy Right 11/06/2012    Procedure: UNILATERAL SALPINGECTOMY;  Surgeon: Delice Lesch, MD;  Location: [facility];  Service: Gynecology;  Laterality: Right;  . Cystoscopy N/A 11/06/2012    Procedure: CYSTOSCOPY;  Surgeon: Delice Lesch, MD;  Location: [facility];  Service: Gynecology;  Laterality: N/A;   Family History:  Family History  Problem Relation Age of Onset  . Hypertension Father   . Arthritis Father   . Glaucoma Father   . Cancer Mother    Family Psychiatric  History: Depression: Mother  Social History:  History  Alcohol Use No  History  Drug Use No    Social History   Social History  . Marital Status: Married    Spouse Name: N/A  . Number of Children: N/A  . Years of Education: N/A   Social History Main Topics  . Smoking status: Never Smoker   . Smokeless tobacco: None  . Alcohol Use: No  . Drug Use: No  . Sexual Activity: Yes    Birth Control/ Protection: Post-menopausal   Other Topics Concern  . None   Social History Narrative    Additional Social History:    Pain Medications: See PTA list Prescriptions: See PTA list Over the Counter: See PTA list History of alcohol / drug use?: No history of alcohol / drug abuse  Sleep:  Improved    Appetite:  Improved   Current Medications: Current Facility-Administered Medications  Medication Dose Route Frequency Provider Last Rate Last Dose  . acetaminophen (TYLENOL) tablet 650 mg  650 mg Oral Q6H PRN Nicholaus Bloom, MD      . alum & mag hydroxide-simeth (MAALOX/MYLANTA) 200-200-20 MG/5ML suspension 30 mL  30 mL Oral Q4H PRN Nicholaus Bloom, MD      . feeding supplement (ENSURE ENLIVE) (ENSURE ENLIVE) liquid 237 mL  237 mL Oral PRN [place] Bibles, RD      . LORazepam (ATIVAN) tablet 0.5 mg  0.5 mg Oral Q6H PRN Myer Peer Cobos, MD      . magnesium hydroxide (MILK OF MAGNESIA) suspension 30 mL  30 mL Oral Daily PRN Nicholaus Bloom, MD      . metoprolol (LOPRESSOR) tablet 50 mg  50 mg Oral BID Nicholaus Bloom, MD   50 mg at 05/25/15 0824  . nortriptyline (PAMELOR) capsule 75 mg  75 mg Oral QHS Jenne Campus, MD   75 mg at 05/24/15 2117  . QUEtiapine (SEROQUEL) tablet 100 mg  100 mg Oral QHS Jenne Campus, MD   100 mg at 05/24/15 2118   Lab Results:  No results found for this or any previous visit (from the past 48 hour(s)). Physical Findings:  AIMS: Facial and Oral Movements Muscles of Facial Expression: None, normal Lips and Perioral Area: None, normal Jaw: None, normal Tongue: None, normal,Extremity Movements Upper (arms, wrists, hands, fingers): None, normal Lower (legs, knees, ankles, toes): None, normal, Trunk Movements Neck, shoulders, hips: None, normal, Overall Severity Severity of abnormal movements (highest score from questions above): None, normal Incapacitation due to abnormal movements: None, normal Patient's awareness of abnormal movements (rate only patient's report): No Awareness, Dental Status Current problems with teeth and/or dentures?: No Does  patient usually wear dentures?: No  CIWA:  CIWA-Ar Total: 2 COWS:  COWS Total Score: 4  Musculoskeletal: Strength & Muscle Tone: within normal limits Gait & Station: normal Patient leans: N/A  Psychiatric Specialty Exam: Review of Systems  Constitutional: Negative.   HENT: Negative.   Eyes: Negative.   Respiratory: Negative.   Cardiovascular: Negative.   Gastrointestinal: Negative.   Genitourinary: Negative.   Musculoskeletal: Negative.   Skin: Negative.   Neurological: Negative.   Endo/Heme/Allergies: Negative.   Psychiatric/Behavioral: Positive for depression. Negative for suicidal ideas, hallucinations, memory loss and substance abuse. The patient is nervous/anxious and has insomnia.     Blood pressure 126/78, pulse 87, temperature 97.1 F (36.2 C), temperature source Oral, resp. rate 16, height 5\' 2"  (1.575 m), weight 140 lb (63.504 kg).Body mass index is 25.6 kg/(m^2).  General Appearance: Casual  Eye Contact::  Good  Speech:  Clear and Coherent  Volume:  Normal  Mood:  Partial improvement , less depressed   Affect:  More reactive, smiling at times appropriately, remains Anxious   Thought Process:  Circumstantial and Tangential  Orientation:  Full (Time, Place, and Person)  Thought Content:  Rumination about medication issues and insomnia   Suicidal Thoughts:  No at this time denies plan or intention of hurting self or of SI  Homicidal Thoughts:  No  Memory:  Recent and remote grossly intact   Judgement:  Fair  Insight:  Present  Psychomotor Activity:  Normal   Concentration:  Fair  Recall:  Good  Fund of Knowledge:Good  Language: Good  Akathisia:  No  Handed:  Right  AIMS (if indicated):     Assets:  Communication Skills Desire for Improvement  ADL's:  Intact  Cognition: WNL  Sleep:  Number of Hours: 6.75  Assessment - patient 's mood is better. Sleep improved as well and states last night was best sleep she has had in weeks . Remains anxious, and presents  with ruminations and worries  Which are generalized but which cluster around medication concerns, fears of side effects at this time. She does not, however, endorse any current side effects, and feels medications are helping . We discussed Seroquel management for insomnia and night time ruminations- she states she slept better, but felt somewhat sedated in AM, when she first woke up. We agreed to taper down seroquel dose slightly to minimize AM sedation  Treatment Plan Summary: 1. Continue crisis management, mood stabilization. Encourage ongoing  [place] participation to work on Radiographer, therapeutic and symptom reduction Continue  Nortriptyline 75 mg QHS  for depression/anxiety /insomnia Decrease Seroquel   To 75 mg QHS for mood control Continue Metoprolol 50 mg QDAY for HTN.   Neita Garnet,  MD  05/25/2015, 3:43 PM

## 2015-05-26 MED ORDER — METOPROLOL TARTRATE 50 MG PO TABS: 50.0000 mg | ORAL_TABLET | Freq: Two times a day (BID) | ORAL | Status: AC

## 2015-05-26 MED ORDER — NORTRIPTYLINE HCL 75 MG PO CAPS: 75.0000 mg | ORAL_CAPSULE | Freq: Every day | ORAL | Status: DC

## 2015-05-26 MED ORDER — ALPRAZOLAM 0.5 MG PO TABS: ORAL_TABLET | ORAL | Status: DC

## 2015-05-26 MED ORDER — QUETIAPINE FUMARATE 25 MG PO TABS: 75.0000 mg | ORAL_TABLET | Freq: Every day | ORAL | Status: DC

## 2015-05-26 MED ORDER — QUETIAPINE FUMARATE 100 MG PO TABS
100.0000 mg | ORAL_TABLET | Freq: Every day | ORAL | Status: DC
  Filled 2015-05-26 (×2): qty 1

## 2015-05-26 NOTE — Progress Notes (Signed)
   D: Pt informed the writer that she had been given 100 mg seroquel the previous night. Stated, "I was a little groggy. Dr said change it to 75 mg". Pt states she plans to discharge tomorrow. Asked the writer she'll be able to speak with there SW "again tomorrow". Stated she "thinks she may consider CBT".  Pt has no other questions or concerns.    A:  Support and encouragement was offered. 15 min checks continued for safety.  R: Pt remains safe.

## 2015-05-26 NOTE — Tx Team (Signed)
Interdisciplinary Treatment Plan Update (Adult) Date: 05/26/2015   Date: 05/26/2015 9:20 AM  Progress in Treatment:  Attending groups: Yes  Participating in groups: Yes Taking medication as prescribed: Yes  Tolerating medication: Yes  Family/Significant othe contact made: No, CSW attempting to make contact with husband Patient understands diagnosis: Yes Discussing patient identified problems/goals with staff: Yes  Medical problems stabilized or resolved: Yes  Denies suicidal/homicidal ideation: Yes Patient has not harmed self or Others: Yes   New problem(s) identified: None identified at this time.   Discharge Plan or Barriers: Pt will discharge home and follow-up with outpatient resources  Additional comments: n/a   Reason for Continuation of Hospitalization:  Anxiety Depression Medication stabilization Suicidal ideation  Estimated length of stay: 0 days; Pt stable for DC today  Review of initial/current patient goals per problem list:   1.  Goal(s): Patient will participate in aftercare plan  Met:  Yes  Target date: 3-5 days from date of admission   As evidenced by: Patient will participate within aftercare plan AEB aftercare provider and housing plan at discharge being identified.   05/24/15: Pt will discharge home and follow-up with outpatient providers  2.  Goal (s): Patient will exhibit decreased depressive symptoms and suicidal ideations.  Met:  Yes  Target date: 3-5 days from date of admission   As evidenced by: Patient will utilize self rating of depression at 3 or below and demonstrate decreased signs of depression or be deemed stable for discharge by MD.  05/24/15: Pt rates depression at 0/10; however endorses passive SI  05/25/15: Pt rates depression at 3/10; denies SI and describes feeling ready for DC.  3.  Goal(s): Patient will demonstrate decreased signs and symptoms of anxiety.  Met:  Yes  Target date: 3-5 days from date of admission   As  evidenced by: Patient will utilize self rating of anxiety at 3 or below and demonstrated decreased signs of anxiety, or be deemed stable for discharge by MD  05/24/15: Pt rates anxiety at 3/10, however is observed to be withdrawn  Attendees:  Patient:    Family:    Physician: Dr. Parke Poisson, MD  05/26/2015 9:20 AM  Nursing: Lars Pinks, RN Case manager  05/26/2015 9:20 AM  Clinical Social Worker Peri Maris, [place] 05/26/2015 9:20 AM  Other: Tilden Fossa, [place] 05/26/2015 9:20 AM  Clinical: Eulogio Bear, RN 05/26/2015 9:20 AM  Other: , RN Charge Nurse 05/26/2015 9:20 AM  Other: Hilda Lias, [place], [facility] [phone]

## 2015-05-26 NOTE — Progress Notes (Signed)
  [facility] Adult Case Management Discharge Plan :  Will you be returning to the same living situation after discharge:  Yes,  Pt returning home At discharge, do you have transportation home?: Yes,  Pt husband/daughter to provide transportation Do you have the ability to pay for your medications: Yes,  Pt provided with prescriptions  Release of information consent forms completed and in the chart;  Patient's signature needed at discharge.  Patient to Follow up at: Follow-up Information    Follow up with [facility]. Go on 06/01/2015.   Specialty:  Behavioral Health   Why:  at 3:15pm with Dr. Clovis Pu for medication management.   Contact information:   [place] [address] [phone]       Follow up with [facility]. Go on 06/23/2015.   Specialty:  Behavioral Health   Why:  at 9:00am for therapy with [facility].   Contact information:   [place] [phone]      Next level of care provider has access to [facility] Link:no  Patient denies SI/HI: Yes,  Pt denies    Safety Planning and Suicide Prevention discussed: Yes,  with Pt; unsuccessful contact attempts with husband  Have you used any form of tobacco in the last 30 days? (Cigarettes, Smokeless Tobacco, Cigars, and/or Pipes): No  Has patient been referred to the Quitline?: N/A patient is not a smoker  Bo Mcclintock 05/26/2015, 9:35 AM

## 2015-05-26 NOTE — BHH Suicide Risk Assessment (Signed)
[facility] Discharge Suicide Risk Assessment   Demographic Factors:  59 year old married female, lives at home with husband   Total Time spent with patient: 30 minutes  Musculoskeletal: Strength & Muscle Tone: within normal limits Gait & Station: normal Patient leans: N/A  Psychiatric Specialty Exam: Physical Exam  ROS  Blood pressure 123/62, pulse 78, temperature 97.5 F (36.4 C), temperature source Oral, resp. rate 16, height 5\' 2"  (1.575 m), weight 140 lb (63.504 kg).Body mass index is 25.6 kg/(m^2).  General Appearance: Well Groomed  Eye Contact::  Good  Speech:  Normal Rate409  Volume:  Normal  Mood:  improved, less depressed, states still feels anxious  Affect:  improved compared to admission, but still tends to be anxious   Thought Process:  Linear  Orientation:  Full (Time, Place, and Person)  Thought Content:  no hallucinations, no delusions  Suicidal Thoughts:  No denies any suicidal or self injurious ideations  Homicidal Thoughts:  No  Memory:  recent and remote grossly intact   Judgement:  Other:  improved  Insight:  improving   Psychomotor Activity:  Normal  Concentration:  Good  Recall:  Good  Fund of Knowledge:Good  Language: Good  Akathisia:  No  Handed:  Right  AIMS (if indicated):     Assets:  Desire for Improvement Housing Resilience Social Support  Sleep:  Number of Hours: 6.75  Cognition: WNL  ADL's:  Intact   Have you used any form of tobacco in the last 30 days? (Cigarettes, Smokeless Tobacco, Cigars, and/or Pipes): No  Has this patient used any form of tobacco in the last 30 days? (Cigarettes, Smokeless Tobacco, Cigars, and/or Pipes) No  Mental Status Per Nursing Assessment::   On Admission:  NA  Current Mental Status by Physician: At this time alert and attentive, well related, remains anxious, but to a lesser degree compared to admission. She has chronic anxiety. States mood is "OK", denies any suicidal or homicidal ideations, denies any self  injurious ideations, no psychotic symptoms.   Loss Factors: Work related stress, concerned husband may lose his job.   Historical Factors: History of depression, anxiety, insomnia. No history of suicide attempts   Risk Reduction Factors:   Sense of responsibility to family, Employed, Living with another person, especially a relative, Positive social support and Positive coping skills or problem solving skills  Continued Clinical Symptoms:  At this time partially improved compared to admission- she is not suicidal, not homicidal , not psychotic. Has chronic anxiety, but has improved partially compared to prior .  Cognitive Features That Contribute To Risk:  No gross cognitive deficits noted upon discharge. Is alert , attentive, and oriented x 3   Suicide Risk:  Mild:  Suicidal ideation of limited frequency, intensity, duration, and specificity.  There are no identifiable plans, no associated intent, mild dysphoria and related symptoms, good self-control (both objective and subjective assessment), few other risk factors, and identifiable protective factors, including available and accessible social support.  Principal Problem: Insomnia secondary to depression with anxiety Discharge Diagnoses:  Patient Active Problem List   Diagnosis Date Noted  . 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 GAD (generalized anxiety disorder) [F41.1] 05/22/2015  . MDD (major depressive disorder) ([place]) [F32.9] 05/21/2015  . Urinary incontinence [R32] 08/07/2012  . Pelvic relaxation [N81.89] 08/07/2012  . Fibroids [D25.9] 08/07/2012    Follow-up Information    Follow up with [facility]. Go on 06/01/2015.   Specialty:  Behavioral Health  Why:  at 3:15pm with Dr. Clovis Pu for medication management.   Contact information:   [place] [address] [phone]       Follow up with [facility]. Go on 06/23/2015.   Specialty:  Behavioral Health   Why:  at 9:00am for therapy with [facility].   Contact information:   [place] [phone]      Plan Of Care/Follow-up recommendations:  Activity:  as tolerated  Diet:  Regular Tests:  NA Other:  see below   Is patient on multiple antipsychotic therapies at discharge:  No   Has Patient had three or more failed trials of antipsychotic monotherapy by history:  No  Recommended Plan for Multiple Antipsychotic Therapies: NA  Patient is leaving in good spirits  States her husband is going to come pick her up later today Plans to follow up as above  Has an established PCP,  Dr. Karenann Cai , for medical issues as needed    [facility], [facility] 05/26/2015, 11:37 AM

## 2015-05-26 NOTE — Progress Notes (Signed)
CSW spoke with Pt who is agreeable to therapy referral at [facility], requesting this even though the first available appt is 12/28.  Peri Maris, [place] Work [phone]

## 2015-05-26 NOTE — Plan of Care (Signed)
Problem: Alteration in mood; excessive anxiety as evidenced by: Goal: STG-Pt can identify coping skills to manage panic/anxiety (Patient can identify at least ____ coping skills to manage panic/anxiety attack)  Outcome: Progressing Pt stated that she would like to stop taking anxiety medication, although was feeling a little nervous this morning.

## 2015-05-26 NOTE — BHH Group Notes (Signed)
[facility] LCSW Aftercare Discharge Planning Group Note  05/26/2015 8:45 AM  Participation Quality: Alert, Appropriate and Oriented  Mood/Affect: Appropriate  Depression Rating: 3  Anxiety Rating: 3  Thoughts of Suicide: Pt denies SI/HI  Will you contract for safety? Yes  Current AVH: Pt denies  Plan for Discharge/Comments: Pt attended discharge planning group and actively participated in group. CSW discussed suicide prevention education with the group and encouraged them to discuss discharge planning and any relevant barriers. Pt expresses feeling ready for discharge and describes a desire to have CBT to help change her negative thinking.  Transportation Means: Pt reports access to transportation  Supports: No supports mentioned at this time  Peri Maris, [place] 05/26/2015 9:22 AM

## 2015-05-26 NOTE — BHH Suicide Risk Assessment (Signed)
BHH INPATIENT:  Family/Significant Other Suicide Prevention Education  Suicide Prevention Education:  Contact Attempts: Levada Nemecek, Pt's husband [phone], has been identified by the patient as the family member/significant other with whom the patient will be residing, and identified as the person(s) who will aid the patient in the event of a mental health crisis.  With written consent from the patient, two attempts were made to provide suicide prevention education, prior to and/or following the patient's discharge.  We were unsuccessful in providing suicide prevention education.  A suicide education pamphlet was given to the patient to share with family/significant other.  Date and time of first attempt: 05/26/15 @ 8:20am Date and time of second attempt: 05/26/15 @ 10:10am  Bo Mcclintock 05/26/2015, 10:09 AM

## 2015-05-26 NOTE — Progress Notes (Signed)
Pt discharged home with the husband. Pt was ambulatory, stable and appreciative at that time. All papers and prescriptions were given and valuables returned. Verbal understanding expressed. Denies SI/HI and A/VH. Pt given opportunity to express concerns and ask questions.  

## 2015-05-26 NOTE — Discharge Summary (Signed)
Physician Discharge Summary Note  Patient:  Megan [name] is an 59 y.o., female MRN:  [id number] DOB:  [birth year] Patient phone:  [phone] (home)  Patient address:   [place] [address],  Total Time spent with patient: Greater than 30 minutes  Date of Admission:  05/21/2015  Date of Discharge: 05-26-15  Reason for Admission: Severe insomnia  Principal Problem: Insomnia secondary to depression with anxiety  Discharge Diagnoses: Patient Active Problem List   Diagnosis Date Noted  . 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 GAD (generalized anxiety disorder) [F41.1] 05/22/2015  . MDD (major depressive disorder) ([place]) [F32.9] 05/21/2015  . Urinary incontinence [R32] 08/07/2012  . Pelvic relaxation [N81.89] 08/07/2012  . Fibroids [D25.9] 08/07/2012   Past Psychiatric History: Anxiety/Insomnia  Past Medical History:  Past Medical History  Diagnosis Date  . Hypertension   . Insomnia 1995  . Urticaria   . Incontinence   . Arthritis   . Asthma     no inhaler use "for months"  . GERD (gastroesophageal reflux disease)     Past Surgical History  Procedure Laterality Date  . Orthoscopic knee surgery  2009, 2011    x2  . Diagnostic laparoscopy    . Laparoscopic hysterectomy N/A 11/06/2012    Procedure: HYSTERECTOMY TOTAL LAPAROSCOPIC;  Surgeon: Delice Lesch, MD;  Location: [facility];  Service: Gynecology;  Laterality: N/A;  . Anterior and posterior repair N/A 11/06/2012    Procedure: ANTERIOR (CYSTOCELE) AND POSTERIOR REPAIR (RECTOCELE);  Surgeon: Delice Lesch, MD;  Location: [facility];  Service: Gynecology;  Laterality: N/A;  . Bladder suspension N/A 11/06/2012    Procedure: TRANSVAGINAL TAPE (TVT) PROCEDURE;  Surgeon: Delice Lesch, MD;  Location: [facility];  Service: Gynecology;  Laterality: N/A;  . Unilateral salpingectomy Right 11/06/2012    Procedure: UNILATERAL SALPINGECTOMY;  Surgeon: Delice Lesch, MD;   Location: [facility];  Service: Gynecology;  Laterality: Right;  . Cystoscopy N/A 11/06/2012    Procedure: CYSTOSCOPY;  Surgeon: Delice Lesch, MD;  Location: [facility];  Service: Gynecology;  Laterality: N/A;   Family History:  Family History  Problem Relation Age of Onset  . Hypertension Father   . Arthritis Father   . Glaucoma Father   . Cancer Mother    Family Psychiatric  History: Major depression: Mother  Social History:  History  Alcohol Use No     History  Drug Use No    Social History   Social History  . Marital Status: Married    Spouse Name: N/A  . Number of Children: N/A  . Years of Education: N/A   Social History Main Topics  . Smoking status: Never Smoker   . Smokeless tobacco: None  . Alcohol Use: No  . Drug Use: No  . Sexual Activity: Yes    Birth Control/ Protection: Post-menopausal   Other Topics Concern  . None   Social History Narrative   Hospital Course:  Megan [name] is a 59 year old Caucasian female. Admitted to [facility] as a walk-in, medically cleared at the [facility], [name]. She reports, "I have severe insomnia. It has been going on x 21 years. I was put on Elavil, has remained on this medicine for 21 years. I started to have memory problems, I suspected that it has to be due to the Elavil. I decided to see a psychiatrist this past August, 2016, who confirmed that the Elavil was the cause of my memory issues. I  was also told that this medicine is bad & dirty.The psychiatrist decided to stop the Elavil & started me on Mirtazapine 30 mg, did not help much. He added Trazodone , which did not help. As of the 16th of this month, he added Nortriptyline, My body is very sensitive to medications. I never had a typical depression like my mother had, rather, I had the associated insomnia. I experienced memory problems for years since being on Amitriptyline. Then, when it was discontinued, I had bad withdrawal symptoms; fatigue, runny nose, weight loss, constant headaches,  poor appetite & photophobia. I did not sleep at all last night.  After admission assessment/evaluation, it was decided that Megan [name] will need medication adjustment to stabilize her insomnia. During one of her routine follow-up care, Megan [name] questioned whether her inability to sleep was as a result of more severe mental illness other than depression or anxiety disorder. She stipulated whether she may have Bipolar disorder. With her consent, she was tried on Depakote, which was later discontinued as she did not think that it will help her. She settled on low dose Seroquel 25 mg Q bedtime. She was maintained on Nortriptyline as she was previously prescribed by her outpatient provider. Megan [name] was also enrolled & participated in the group counseling sessions being offered & held on this unit. She learned coping skills.  During the course of her hospital stay, Megan [name]'s symptoms were evaluated on daily basis to assure her response to her treatment regimen. Her symptoms did respond positively to her treatment regimen as evidenced by her reports of improved mood/insomnia. She was resumed on all her other pertinent home medications for her other previously existing medical issues. She tolerated her treatment regimen without any significant adverse effects or reactions reported.  Megan [name] is currently being discharged to her home to continue psychiatric treatment on an outpatient basis as noted below. Upon discharge, she adamantly denies any SIHI, AVH, delusional thoughts or paranoia. She was provided with the necessary information required to make her outpatient appointment without problems. She left [facility] in no apparent distress. Transportation per family.  Physical Findings:  AIMS: Facial and Oral Movements Muscles of Facial Expression: None, normal Lips and Perioral Area: None, normal Jaw: None, normal Tongue: None, normal,Extremity Movements Upper (arms, wrists, hands, fingers): None, normal Lower (legs, knees, ankles,  toes): None, normal, Trunk Movements Neck, shoulders, hips: None, normal, Overall Severity Severity of abnormal movements (highest score from questions above): None, normal Incapacitation due to abnormal movements: None, normal Patient's awareness of abnormal movements (rate only patient's report): No Awareness, Dental Status Current problems with teeth and/or dentures?: No Does patient usually wear dentures?: No  CIWA:  CIWA-Ar Total: 2 COWS:  COWS Total Score: 4  Musculoskeletal: Strength & Muscle Tone: within normal limits Gait & Station: normal Patient leans: N/A  Psychiatric Specialty Exam: Review of Systems  Constitutional: Negative.   HENT: Negative.   Eyes: Negative.   Respiratory: Negative.   Cardiovascular: Negative.   Gastrointestinal: Negative.   Genitourinary: Negative.   Musculoskeletal: Negative.   Skin: Negative.   Neurological: Negative.   Endo/Heme/Allergies: Negative.   Psychiatric/Behavioral: Positive for depression (Stable). Negative for suicidal ideas, hallucinations, memory loss and substance abuse. The patient has insomnia (Stable). The patient is not nervous/anxious.     Blood pressure 123/62, pulse 78, temperature 97.5 F (36.4 C), temperature source Oral, resp. rate 16, height 5\' 2"  (1.575 m), weight 63.504 kg (140 lb).Body mass index is 25.6 kg/(m^2).  See Md's SRA   Have  you used any form of tobacco in the last 30 days? (Cigarettes, Smokeless Tobacco, Cigars, and/or Pipes): No  Has this patient used any form of tobacco in the last 30 days? (Cigarettes, Smokeless Tobacco, Cigars, and/or Pipes) Yes, N/A  Metabolic Disorder Labs:  No results found for: HGBA1C, MPG No results found for: PROLACTIN No results found for: CHOL, TRIG, HDL, CHOLHDL, VLDL, LDLCALC  See Psychiatric Specialty Exam and Suicide Risk Assessment completed by Attending Physician prior to discharge.  Discharge destination:  Home  Is patient on multiple antipsychotic therapies  at discharge:  No   Has Patient had three or more failed trials of antipsychotic monotherapy by history:  No  Recommended Plan for Multiple Antipsychotic Therapies: NA    Medication List    STOP taking these medications        Eszopiclone 3 MG Tabs     ibuprofen 600 MG tablet  Commonly known as:  ADVIL,MOTRIN     levocetirizine 5 MG tablet  Commonly known as:  XYZAL     Melatonin 3 MG Tabs     ondansetron 4 MG tablet  Commonly known as:  ZOFRAN     OVER THE COUNTER MEDICATION     oxyCODONE-acetaminophen 5-325 MG tablet  Commonly known as:  PERCOCET/ROXICET      TAKE these medications      Indication   ALPRAZolam 0.5 MG tablet  Commonly known as:  XANAX  take 1 tablet by mouth at bedtime if needed for insomnia   Indication:  Feeling Anxious, Insomnia     metoprolol 50 MG tablet  Commonly known as:  LOPRESSOR  Take 1 tablet (50 mg total) by mouth 2 (two) times daily. For high blood pressure   Indication:  High Blood Pressure     nortriptyline 75 MG capsule  Commonly known as:  PAMELOR  Take 1 capsule (75 mg total) by mouth at bedtime. For depression/insomnia   Indication:  Depression, Trouble Sleeping     QUEtiapine 25 MG tablet  Commonly known as:  SEROQUEL  Take 3 tablets (75 mg total) by mouth at bedtime. For insomnia/mood control   Indication:  Mood control/insomnia       Follow-up Information    Follow up with [facility]. Go on 06/01/2015.   Specialty:  Behavioral Health   Why:  at 3:15pm with Dr. Clovis Pu for medication management.   Contact information:   [place] [address] [phone]       Follow up with [facility]. Go on 06/23/2015.   Specialty:  Behavioral Health   Why:  at 9:00am for therapy with [facility].   Contact information:   [place] [phone]     Follow-up recommendations: Activity:  As  tolerated Diet: As recommended by your primary care doctor. Keep all scheduled follow-up appointments as recommended.    Comments: Take all your medications as prescribed by your mental healthcare provider. Report any adverse effects and or reactions from your medicines to your outpatient provider promptly. Patient is instructed and cautioned to not engage in alcohol and or illegal drug use while on prescription medicines. In the event of worsening symptoms, patient is instructed to call the crisis hotline, 911 and or go to the nearest ED for appropriate evaluation and treatment of symptoms. Follow-up with your primary care provider for your other medical issues, concerns and or health care needs.   Signed: Encarnacion Slates, PMHNP,  FNP-BC 05/26/2015, 11:03 AM   Patient seen, Suicide Assessment Completed.  Disposition Plan Reviewed

## 2015-06-23 ENCOUNTER — Encounter (INDEPENDENT_AMBULATORY_CARE_PROVIDER_SITE_OTHER): Payer: Self-pay

## 2015-06-23 ENCOUNTER — Ambulatory Visit (INDEPENDENT_AMBULATORY_CARE_PROVIDER_SITE_OTHER): Payer: BC Managed Care – PPO | Admitting: Clinical

## 2015-06-23 ENCOUNTER — Encounter (HOSPITAL_COMMUNITY): Payer: Self-pay | Admitting: Clinical

## 2015-06-23 DIAGNOSIS — G4700 Insomnia, unspecified: Secondary | ICD-10-CM

## 2015-06-23 DIAGNOSIS — F331 Major depressive disorder, recurrent, moderate: Secondary | ICD-10-CM | POA: Diagnosis not present

## 2015-06-23 DIAGNOSIS — F411 Generalized anxiety disorder: Secondary | ICD-10-CM | POA: Diagnosis not present
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
## 2015-06-25 NOTE — Addendum Note (Signed)
Addended by: Francee Piccolo A on: 06/25/2015 12:32 PM   Modules accepted: Level of Service

## 2015-07-26 ENCOUNTER — Ambulatory Visit (INDEPENDENT_AMBULATORY_CARE_PROVIDER_SITE_OTHER): Payer: BC Managed Care – PPO | Admitting: Clinical

## 2015-07-26 ENCOUNTER — Encounter (HOSPITAL_COMMUNITY): Payer: Self-pay | Admitting: Clinical

## 2015-07-26 DIAGNOSIS — F331 Major depressive disorder, recurrent, moderate: Secondary | ICD-10-CM | POA: Diagnosis not present

## 2015-07-26 DIAGNOSIS — G4700 Insomnia, unspecified: Secondary | ICD-10-CM | POA: Diagnosis not present

## 2015-07-26 DIAGNOSIS — F411 Generalized anxiety disorder: Secondary | ICD-10-CM

## 2015-07-26 NOTE — Progress Notes (Signed)
   THERAPIST PROGRESS NOTE  Session Time: 3:30 -4:28  Participation Level: Active  Behavioral Response: CasualAlertDepressed  Type of Therapy: Individual Therapy  Treatment Goals addressed: improve psychiatric symptoms, elevate mood (improved self-esteem and confidence), improve unhelpful thought patterns, reduce irrational worries and fears (accurately interpret ordinary events and situations),   Interventions: CBT and Motivational Interviewing, Grounding and Mindfulness Techniques, psychoeducation  Summary: Evelen Vazguez is a 60 y.o. female who presents with Major Depressive Disorder, recurrent, moderate, and Generalized Anxiety Disorder and Insomnia  Suicidal/Homicidal: No -without intent/plan  Therapist Response:  Anuhea met with clinician for an individual session. Marydiscussed her psychiatric symptoms, her current life events, and her goals for therapy. Kaleiyah shared that she has low self esteem due to her depression. She shared that this is causing her to have an especially difficult time right now. 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 She shared that she may loose her job because of it. She shared she has a lot of anxiety about this because her income is needed. Marthella then shared some of her automatic (catastophic) thoughts. Clinician introduced grounding and mindfulness techniques. Clinician explained the process, purpose and practice of the techniques. Client and clinician practiced some of the techniques together. Clinician introduced some basic cbt techniques. Client and clinician discussed how our thoughts inform our emotions. Clinician asked open ended questions about Raeden's options. Alezandra stated she could look for another job so that if she is fired she has things in the works, she could find out resources available to her, she could ask  more questions of her principle. Jancie and clinician discussed which felt better - focusing on fear thoughts verse focusing on solutions. Clinician introduced a self esteem homework packet which Kaitlynn agreed to complete and bring back with her at next session. She agreed to practice her grounding and mindfulness techniques until next session.    Plan: Return again in 1 weeks.  Diagnosis: Axis I: Major Depressive Disorder, recurrent, moderate, and Generalized Anxiety Disorder and Insomnia    Powell,Frances A, LCSW 07/26/2015

## 2015-08-09 ENCOUNTER — Ambulatory Visit (HOSPITAL_COMMUNITY): Payer: Self-pay | Admitting: Clinical

## 2015-08-23 ENCOUNTER — Ambulatory Visit (HOSPITAL_COMMUNITY): Payer: Self-pay | Admitting: Clinical

## 2015-08-24 ENCOUNTER — Ambulatory Visit (HOSPITAL_COMMUNITY): Payer: Self-pay | Admitting: Clinical

## 2015-09-06 ENCOUNTER — Ambulatory Visit (HOSPITAL_COMMUNITY): Payer: Self-pay | Admitting: Clinical

## 2016-01-17 ENCOUNTER — Other Ambulatory Visit: Payer: Self-pay | Admitting: Family Medicine

## 2016-01-17 DIAGNOSIS — Z1231 Encounter for screening mammogram for malignant neoplasm of breast: Secondary | ICD-10-CM

## 2016-01-28 ENCOUNTER — Ambulatory Visit
Admission: RE | Admit: 2016-01-28 | Discharge: 2016-01-28 | Disposition: A | Discharge status: Home / Self Care | Payer: BC Managed Care – PPO | Source: Ambulatory Visit | Attending: Family Medicine | Admitting: Family Medicine

## 2016-01-28 DIAGNOSIS — Z1231 Encounter for screening mammogram for malignant neoplasm of breast: Secondary | ICD-10-CM

## 2016-02-01 ENCOUNTER — Other Ambulatory Visit: Payer: Self-pay | Admitting: Family Medicine

## 2016-02-01 DIAGNOSIS — R928 Other abnormal and inconclusive findings on diagnostic imaging of breast: Secondary | ICD-10-CM

## 2016-02-03 ENCOUNTER — Ambulatory Visit
Admission: RE | Admit: 2016-02-03 | Discharge: 2016-02-03 | Disposition: A | Discharge status: Home / Self Care | Payer: BC Managed Care – PPO | Source: Ambulatory Visit | Attending: Family Medicine | Admitting: Family Medicine

## 2016-02-03 DIAGNOSIS — R928 Other abnormal and inconclusive findings on diagnostic imaging of breast: Secondary | ICD-10-CM

## 2016-09-11 ENCOUNTER — Other Ambulatory Visit: Payer: Self-pay | Admitting: Family Medicine

## 2016-09-11 ENCOUNTER — Other Ambulatory Visit: Payer: Self-pay

## 2016-09-11 DIAGNOSIS — N63 Unspecified lump in unspecified breast: Secondary | ICD-10-CM

## 2016-09-25 ENCOUNTER — Ambulatory Visit
Admission: RE | Admit: 2016-09-25 | Discharge: 2016-09-25 | Disposition: A | Discharge status: Home / Self Care | Payer: BC Managed Care – PPO | Source: Ambulatory Visit

## 2016-09-25 ENCOUNTER — Ambulatory Visit
Admission: RE | Admit: 2016-09-25 | Discharge: 2016-09-25 | Disposition: A | Discharge status: Home / Self Care | Payer: BC Managed Care – PPO | Source: Ambulatory Visit | Attending: Family Medicine | Admitting: Family Medicine

## 2016-09-25 DIAGNOSIS — N63 Unspecified lump in unspecified breast: Secondary | ICD-10-CM

## 2017-01-01 ENCOUNTER — Other Ambulatory Visit: Payer: Self-pay | Admitting: Family Medicine

## 2017-01-01 DIAGNOSIS — N6489 Other specified disorders of breast: Secondary | ICD-10-CM

## 2017-01-29 ENCOUNTER — Ambulatory Visit
Admission: RE | Admit: 2017-01-29 | Discharge: 2017-01-29 | Disposition: A | Discharge status: Home / Self Care | Payer: BC Managed Care – PPO | Source: Ambulatory Visit | Attending: Family Medicine | Admitting: Family Medicine

## 2017-01-29 DIAGNOSIS — N6489 Other specified disorders of breast: Secondary | ICD-10-CM

## 2017-07-25 ENCOUNTER — Other Ambulatory Visit: Payer: Self-pay | Admitting: Orthopedic Surgery

## 2017-07-25 DIAGNOSIS — M25512 Pain in left shoulder: Secondary | ICD-10-CM

## 2017-08-02 ENCOUNTER — Ambulatory Visit
Admission: RE | Admit: 2017-08-02 | Discharge: 2017-08-02 | Disposition: A | Discharge status: Home / Self Care | Payer: 59 | Source: Ambulatory Visit | Attending: Orthopedic Surgery | Admitting: Orthopedic Surgery

## 2017-08-02 DIAGNOSIS — M25512 Pain in left shoulder: Secondary | ICD-10-CM

## 2017-12-20 ENCOUNTER — Other Ambulatory Visit: Payer: Self-pay | Admitting: Family Medicine

## 2017-12-20 DIAGNOSIS — Z1231 Encounter for screening mammogram for malignant neoplasm of breast: Secondary | ICD-10-CM

## 2018-01-30 ENCOUNTER — Ambulatory Visit
Admission: RE | Admit: 2018-01-30 | Discharge: 2018-01-30 | Disposition: A | Discharge status: Home / Self Care | Payer: BLUE CROSS/BLUE SHIELD | Source: Ambulatory Visit | Attending: Family Medicine | Admitting: Family Medicine

## 2018-01-30 DIAGNOSIS — Z1231 Encounter for screening mammogram for malignant neoplasm of breast: Secondary | ICD-10-CM

## 2018-06-09 ENCOUNTER — Encounter: Payer: Self-pay | Admitting: Emergency Medicine

## 2018-06-25 ENCOUNTER — Encounter: Payer: Self-pay | Admitting: Psychiatry

## 2018-06-25 ENCOUNTER — Ambulatory Visit: Payer: BC Managed Care – PPO | Admitting: Psychiatry

## 2018-06-25 DIAGNOSIS — F409 Phobic anxiety disorder, unspecified: Secondary | ICD-10-CM | POA: Diagnosis not present

## 2018-06-25 DIAGNOSIS — F5105 Insomnia due to other mental disorder: Secondary | ICD-10-CM | POA: Diagnosis not present

## 2018-06-25 DIAGNOSIS — F411 Generalized anxiety disorder: Secondary | ICD-10-CM | POA: Diagnosis not present

## 2018-06-25 MED ORDER — OLANZAPINE 2.5 MG PO TABS: 3.7500 mg | ORAL_TABLET | Freq: Every day | ORAL | 1 refills | Status: DC

## 2018-06-25 MED ORDER — PAROXETINE HCL 10 MG PO TABS: 10.0000 mg | ORAL_TABLET | Freq: Every day | ORAL | 1 refills | Status: DC

## 2018-06-25 NOTE — Progress Notes (Signed)
[name] [id number] 11-13-1955 62 y.o.  Subjective:   Patient ID:  Megan [name] is a 62 y.o. (DOB 05-26-1956) female.  Chief Complaint:  Chief Complaint  Patient presents with  . Follow-up    Medication Management  . Sleeping Problem    HPI [name] presents to the office today for follow-up of anxiety and insomnia chronic.  Anxiety is manageable overall.  1 episode travelling but manageable.  Did enjoy herself.  D lives in [place].  Sleep good overall with the Zyprexa but she goes to bed late and sleeps late.  Gets adequate amount of sleep.  Seems to work best for her sleep problems.  Patient reports stable mood and denies depressed or irritable moods.  Patient denies any recent difficulty with anxiety.  Patient denies difficulty with sleep initiation or maintenance. Denies appetite disturbance.  Patient reports that energy and motivation have been good.  Patient denies any difficulty with concentration.  Patient denies any suicidal ideation.  Prior psychiatric medication failures include mirtazapine, nortriptyline, Wellbutrin, Prozac with side effects, Seroquel, and Xanax and lorazepam and amitriptyline 75 mg.  She had taken amitriptyline from 1995 into 2016.  It was stopped in part due to worsening memory  Review of Systems:  Review of Systems  Genitourinary: Positive for frequency.  Neurological: Negative for tremors and weakness.       Bruxism   Psychiatric/Behavioral: Positive for sleep disturbance. Negative for agitation, behavioral problems, confusion, decreased concentration, dysphoric mood, hallucinations, self-injury and suicidal ideas. The patient is not nervous/anxious and is not hyperactive.     Medications: I have reviewed the patient's current medications.  Current Outpatient Medications  Medication Sig Dispense Refill  . hydrochlorothiazide (HYDRODIURIL) 12.5 MG tablet Take 12.5 mg by mouth daily.    Marland Kitchen levocetirizine (XYZAL) 5 MG tablet Take 5 mg by mouth every  evening.    . metoprolol (LOPRESSOR) 50 MG tablet Take 1 tablet (50 mg total) by mouth 2 (two) times daily. For high blood pressure    . OLANZapine (ZYPREXA) 2.5 MG tablet Take 1.5 tablets (3.75 mg total) by mouth at bedtime. 135 tablet 1  . PARoxetine (PAXIL) 10 MG tablet Take 1 tablet (10 mg total) by mouth daily. 90 tablet 1   No current facility-administered medications for this visit.     Medication Side Effects: Other: weight and clench teeth About the same as before.  Allergies:  Allergies  Allergen Reactions  . Advair Diskus [Fluticasone-Salmeterol]     continuous coughing    Past Medical History:  Diagnosis Date  . Arthritis   . Asthma    no inhaler use "for months"  . GERD (gastroesophageal reflux disease)   . Hypertension   . Incontinence   . Insomnia 1995  . Urticaria     Family History  Problem Relation Age of Onset  . Hypertension Father   . Arthritis Father   . Glaucoma Father   . Cancer Mother   . Depression Mother   . Anxiety disorder Mother     Social History   Socioeconomic History  . Marital status: Married    Spouse name: Not on file  . Number of children: Not on file  . Years of education: Not on file  . Highest education level: Not on file  Occupational History  . Not on file  Social Needs  . Financial resource strain: Not on file  . Food insecurity:    Worry: Not on file    Inability: Not  on file  . Transportation needs:    Medical: Not on file    Non-medical: Not on file  Tobacco Use  . Smoking status: Never Smoker  . Smokeless tobacco: Never Used  Substance and Sexual Activity  . Alcohol use: No  . Drug use: No  . Sexual activity: Yes    Birth control/protection: Post-menopausal  Lifestyle  . Physical activity:    Days per week: Not on file    Minutes per session: Not on file  . Stress: Not on file  Relationships  . Social connections:    Talks on phone: Not on file    Gets together: Not on file    Attends religious  service: Not on file    Active member of club or organization: Not on file    Attends meetings of clubs or organizations: Not on file    Relationship status: Not on file  . Intimate partner violence:    Fear of current or ex partner: Not on file    Emotionally abused: Not on file    Physically abused: Not on file    Forced sexual activity: Not on file  Other Topics Concern  . Not on file  Social History Narrative  . Not on file    Past Medical History, Surgical history, Social history, and Family history were reviewed and updated as appropriate.   Please see review of systems for further details on the patient's review from today.   Objective:   Physical Exam:  There were no vitals taken for this visit.  Physical Exam Constitutional:      General: She is not in acute distress.    Appearance: She is well-developed.  Musculoskeletal:        General: No deformity.  Neurological:     Mental Status: She is alert and oriented to person, place, and time.     Motor: No tremor.     Coordination: Coordination normal.     Gait: Gait normal.  Psychiatric:        Attention and Perception: Attention and perception normal.        Mood and Affect: Mood is not anxious or depressed. Affect is not labile, blunt, angry or inappropriate.        Speech: Speech normal.        Behavior: Behavior normal.        Thought Content: Thought content normal. Thought content does not include homicidal or suicidal ideation. Thought content does not include homicidal or suicidal plan.        Cognition and Memory: Cognition normal.        Judgment: Judgment normal.     Comments: Insight intact. No auditory or visual hallucinations. No delusions.       Lab Review:     Component Value Date/Time   NA 137 05/21/2015 1747   K 4.0 05/21/2015 1747   CL 102 05/21/2015 1747   CO2 30 05/21/2015 1747   GLUCOSE 104 (H) 05/21/2015 1747   BUN 14 05/21/2015 1747   CREATININE 0.75 05/21/2015 1747   CALCIUM  9.4 05/21/2015 1747   PROT 7.8 05/21/2015 1747   ALBUMIN 4.7 05/21/2015 1747   AST 20 05/21/2015 1747   ALT 23 05/21/2015 1747   ALKPHOS 81 05/21/2015 1747   BILITOT 0.5 05/21/2015 1747   GFRNONAA >60 05/21/2015 1747   GFRAA >60 05/21/2015 1747       Component Value Date/Time   WBC 10.4 05/21/2015 1747   RBC 5.07 05/21/2015  1747   HGB 15.0 05/21/2015 1747   HCT 45.5 05/21/2015 1747   PLT 443 (H) 05/21/2015 1747   MCV 89.7 05/21/2015 1747   MCH 29.6 05/21/2015 1747   MCHC 33.0 05/21/2015 1747   RDW 12.4 05/21/2015 1747    No results found for: POCLITH, LITHIUM   No results found for: PHENYTOIN, PHENOBARB, VALPROATE, CBMZ   .res Assessment: Plan:    Generalized anxiety disorder  Phobic disorder  Insomnia due to mental condition   Amiya has a history of 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  She tried multiple medications without sufficient success but has had good results from the combination of low-dose paroxetine plus olanzapine.  Other dosages have failed to adequately control symptoms.  She tolerates the medication reasonably well with some bruxism but that appears stable.  Gets yearly physcial for labs.  Discussed potential metabolic side effects associated with atypical antipsychotics, as well as potential risk for movement side effects. Advised pt to contact office if movement side effects occur.   Follow-up 6 months  Lynder Parents, MD, DFAPA  Please see After Visit Summary for patient specific instructions.  Future Appointments  Date Time Provider [place]  12/17/2018  3:15 PM Cottle, Billey Co., MD [facility] None    No orders of the defined types were placed in this encounter.     -------------------------------

## 2018-12-17 ENCOUNTER — Encounter: Payer: Self-pay | Admitting: Psychiatry

## 2018-12-17 ENCOUNTER — Other Ambulatory Visit: Payer: Self-pay

## 2018-12-17 ENCOUNTER — Ambulatory Visit: Payer: BC Managed Care – PPO | Admitting: Psychiatry

## 2018-12-17 DIAGNOSIS — F5105 Insomnia due to other mental disorder: Secondary | ICD-10-CM

## 2018-12-17 DIAGNOSIS — F409 Phobic anxiety disorder, unspecified: Secondary | ICD-10-CM

## 2018-12-17 DIAGNOSIS — G4721 Circadian rhythm sleep disorder, delayed sleep phase type: Secondary | ICD-10-CM

## 2018-12-17 DIAGNOSIS — F411 Generalized anxiety disorder: Secondary | ICD-10-CM

## 2018-12-17 MED ORDER — OLANZAPINE 2.5 MG PO TABS: 3.7500 mg | ORAL_TABLET | Freq: Every day | ORAL | 1 refills | Status: DC

## 2018-12-17 MED ORDER — PAROXETINE HCL 10 MG PO TABS: 10.0000 mg | ORAL_TABLET | Freq: Every day | ORAL | 1 refills | Status: DC

## 2018-12-17 NOTE — Progress Notes (Signed)
Megan [name] [id number] [birth year] 63 y.o.  Subjective:   Patient ID:  Megan [name] is a 63 y.o. (DOB 10-Mar-1956) female.  Chief Complaint:  Chief Complaint  Patient presents with  . Anxiety    med mangement  . Sleeping Problem    HPI RAMANDA PAULES presents to the office today for follow-up of anxiety and insomnia chronic.  Last seen June 25, 2018.  No meds were changed.  Fine without significant changes.  Has been healthy.  No sign anxiety.  Sleep is still a struggle for her.  Takes a couple of hours to fall asleep sometimes. Average to sleep at 2 AM and awaken usually 11:30.    Enjoys TV and pets. D in [place] and doing well.  Patient reports stable mood and denies depressed or irritable moods.  Patient denies any recent difficulty with anxiety.   Denies appetite disturbance.  Patient reports that energy and motivation have been good.  Patient denies any difficulty with concentration.  Patient denies any suicidal ideation.  Prior psychiatric medication failures include mirtazapine, nortriptyline, Wellbutrin, Prozac with side effects, Seroquel, and Xanax and lorazepam and amitriptyline 75 mg.  She had taken amitriptyline from 1995 into 2016.  It was stopped in part due to worsening memory   Review of Systems:  Review of Systems  Genitourinary: Positive for frequency.  Neurological: Negative for tremors and weakness.       Bruxism   Psychiatric/Behavioral: Positive for sleep disturbance. Negative for agitation, behavioral problems, confusion, decreased concentration, dysphoric mood, hallucinations, self-injury and suicidal ideas. The patient is not nervous/anxious and is not hyperactive.     Medications: I have reviewed the patient's current medications.  Current Outpatient Medications  Medication Sig Dispense Refill  . hydrochlorothiazide (HYDRODIURIL) 12.5 MG tablet Take 12.5 mg by mouth daily.    Marland Kitchen levocetirizine (XYZAL) 5 MG tablet Take 5 mg by mouth every evening.    .  metoprolol (LOPRESSOR) 50 MG tablet Take 1 tablet (50 mg total) by mouth 2 (two) times daily. For high blood pressure    . OLANZapine (ZYPREXA) 2.5 MG tablet Take 1.5 tablets (3.75 mg total) by mouth at bedtime. 135 tablet 1  . PARoxetine (PAXIL) 10 MG tablet Take 1 tablet (10 mg total) by mouth daily. 90 tablet 1   No current facility-administered medications for this visit.     Medication Side Effects: Other: weight and clench teeth OCC urinary problems.  Less teeth grinding.  Allergies:  Allergies  Allergen Reactions  . Advair Diskus [Fluticasone-Salmeterol]     continuous coughing    Past Medical History:  Diagnosis Date  . Arthritis   . Asthma    no inhaler use "for months"  . GERD (gastroesophageal reflux disease)   . Hypertension   . Incontinence   . Insomnia 1995  . Urticaria     Family History  Problem Relation Age of Onset  . Hypertension Father   . Arthritis Father   . Glaucoma Father   . Cancer Mother   . Depression Mother   . Anxiety disorder Mother     Social History   Socioeconomic History  . Marital status: Married    Spouse name: Not on file  . Number of children: Not on file  . Years of education: Not on file  . Highest education level: Not on file  Occupational History  . Not on file  Social Needs  . Financial resource strain: Not on file  . Food insecurity  Worry: Not on file    Inability: Not on file  . Transportation needs    Medical: Not on file    Non-medical: Not on file  Tobacco Use  . Smoking status: Never Smoker  . Smokeless tobacco: Never Used  Substance and Sexual Activity  . Alcohol use: No  . Drug use: No  . Sexual activity: Yes    Birth control/protection: Post-menopausal  Lifestyle  . Physical activity    Days per week: Not on file    Minutes per session: Not on file  . Stress: Not on file  Relationships  . Social Herbalist on phone: Not on file    Gets together: Not on file    Attends religious  service: Not on file    Active member of club or organization: Not on file    Attends meetings of clubs or organizations: Not on file    Relationship status: Not on file  . Intimate partner violence    Fear of current or ex partner: Not on file    Emotionally abused: Not on file    Physically abused: Not on file    Forced sexual activity: Not on file  Other Topics Concern  . Not on file  Social History Narrative  . Not on file    Past Medical History, Surgical history, Social history, and Family history were reviewed and updated as appropriate.   Retired from Aeronautical engineer.  Please see review of systems for further details on the patient's review from today.   Objective:   Physical Exam:  There were no vitals taken for this visit.  Physical Exam Constitutional:      General: She is not in acute distress.    Appearance: She is well-developed.  Musculoskeletal:        General: No deformity.  Neurological:     Mental Status: She is alert and oriented to person, place, and time.     Motor: No tremor.     Coordination: Coordination normal.     Gait: Gait normal.  Psychiatric:        Attention and Perception: Attention and perception normal.        Mood and Affect: Mood is not anxious or depressed. Affect is not labile, blunt, angry or inappropriate.        Speech: Speech normal.        Behavior: Behavior normal.        Thought Content: Thought content normal. Thought content does not include homicidal or suicidal ideation. Thought content does not include homicidal or suicidal plan.        Cognition and Memory: Cognition normal.        Judgment: Judgment normal.     Comments: Insight intact. No auditory or visual hallucinations. No delusions.       Lab Review:     Component Value Date/Time   NA 137 05/21/2015 1747   K 4.0 05/21/2015 1747   CL 102 05/21/2015 1747   CO2 30 05/21/2015 1747   GLUCOSE 104 (H) 05/21/2015 1747   BUN 14 05/21/2015 1747   CREATININE 0.75  05/21/2015 1747   CALCIUM 9.4 05/21/2015 1747   PROT 7.8 05/21/2015 1747   ALBUMIN 4.7 05/21/2015 1747   AST 20 05/21/2015 1747   ALT 23 05/21/2015 1747   ALKPHOS 81 05/21/2015 1747   BILITOT 0.5 05/21/2015 1747   GFRNONAA >60 05/21/2015 1747   GFRAA >60 05/21/2015 1747  Component Value Date/Time   WBC 10.4 05/21/2015 1747   RBC 5.07 05/21/2015 1747   HGB 15.0 05/21/2015 1747   HCT 45.5 05/21/2015 1747   PLT 443 (H) 05/21/2015 1747   MCV 89.7 05/21/2015 1747   MCH 29.6 05/21/2015 1747   MCHC 33.0 05/21/2015 1747   RDW 12.4 05/21/2015 1747    No results found for: POCLITH, LITHIUM   No results found for: PHENYTOIN, PHENOBARB, VALPROATE, CBMZ   .res Assessment: Plan:    Lashaundra was seen today for anxiety and sleeping problem.  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  She tried multiple medications without sufficient success but has had good results from the combination of low-dose paroxetine plus olanzapine.  Other dosages have failed to adequately control symptoms.  She tolerates the medication reasonably well with some bruxism but that appears stable.  Gets yearly physcial for labs.  Discussed potential metabolic side effects associated with atypical antipsychotics, as well as potential risk for movement side effects. Advised pt to contact office if movement side effects occur.   No sig TD  No med changes.    Follow-up 6 months  Lynder Parents, MD, DFAPA  Please see After Visit Summary for patient specific instructions.  No future appointments.  No orders of the defined types were placed in this encounter.     -------------------------------

## 2018-12-23 ENCOUNTER — Other Ambulatory Visit: Payer: Self-pay | Admitting: Family Medicine

## 2018-12-23 DIAGNOSIS — Z1231 Encounter for screening mammogram for malignant neoplasm of breast: Secondary | ICD-10-CM

## 2019-02-03 ENCOUNTER — Other Ambulatory Visit: Payer: Self-pay

## 2019-02-03 ENCOUNTER — Ambulatory Visit
Admission: RE | Admit: 2019-02-03 | Discharge: 2019-02-03 | Disposition: A | Discharge status: Home / Self Care | Payer: BC Managed Care – PPO | Source: Ambulatory Visit | Attending: Family Medicine | Admitting: Family Medicine

## 2019-02-03 DIAGNOSIS — Z1231 Encounter for screening mammogram for malignant neoplasm of breast: Secondary | ICD-10-CM

## 2019-05-01 ENCOUNTER — Other Ambulatory Visit: Payer: Self-pay | Admitting: Psychiatry

## 2019-06-16 ENCOUNTER — Encounter: Payer: Self-pay | Admitting: Psychiatry

## 2019-06-16 ENCOUNTER — Ambulatory Visit (INDEPENDENT_AMBULATORY_CARE_PROVIDER_SITE_OTHER): Payer: BC Managed Care – PPO | Admitting: Psychiatry

## 2019-06-16 ENCOUNTER — Other Ambulatory Visit: Payer: Self-pay

## 2019-06-16 DIAGNOSIS — F409 Phobic anxiety disorder, unspecified: Secondary | ICD-10-CM | POA: Diagnosis not present

## 2019-06-16 DIAGNOSIS — F411 Generalized anxiety disorder: Secondary | ICD-10-CM

## 2019-06-16 DIAGNOSIS — G4721 Circadian rhythm sleep disorder, delayed sleep phase type: Secondary | ICD-10-CM | POA: Diagnosis not present

## 2019-06-16 DIAGNOSIS — F5105 Insomnia due to other mental disorder: Secondary | ICD-10-CM

## 2019-06-16 MED ORDER — PAROXETINE HCL 10 MG PO TABS: 10.0000 mg | ORAL_TABLET | Freq: Every day | ORAL | 1 refills | Status: DC

## 2019-06-16 MED ORDER — OLANZAPINE 7.5 MG PO TABS: 3.7500 mg | ORAL_TABLET | Freq: Every day | ORAL | 1 refills | Status: DC

## 2019-06-16 NOTE — Progress Notes (Signed)
Megan [name] [id number] 1956/06/14 63 y.o.  Subjective:   Patient ID:  Megan [name] is a 63 y.o. (DOB August 03, 1955) female.  Chief Complaint:  Chief Complaint  Patient presents with  . Follow-up    Medication Management  . Anxiety    Medication Management  . Depression    Medication Management    Anxiety Patient reports no confusion, decreased concentration, nervous/anxious behavior or suicidal ideas.    Depression        Associated symptoms include no decreased concentration and no suicidal ideas.  Past medical history includes anxiety.    Megan [name] presents to the office today for follow-up of anxiety and insomnia chronic.  Last seen June 2, 23 2020.  No meds were changed.  Paroxetine 10 mg daily, olanzapine 3.75 mg daily.  Fine without significant changes.  Has been healthy except BP problems.    No sign anxiety.  Sleep is still a struggle for her.  Takes a couple of hours to fall asleep sometimes. Average to sleep at 2 AM and awaken usually 11:30.  Usually sleeps pretty well.  Enjoys TV and pets. D in [place] and doing well.  Patient reports stable mood and denies depressed or irritable moods.  Patient denies any recent difficulty with anxiety.   Denies appetite disturbance.  Patient reports that energy and motivation have been good.  Patient denies any difficulty with concentration.  Patient denies any suicidal ideation.  Prior psychiatric medication failures include mirtazapine, nortriptyline, Wellbutrin, Prozac with side effects, Seroquel, and Xanax and lorazepam and amitriptyline 75 mg.  Paroxetine Olanzapine 3.75 mg daily  She had taken amitriptyline from 1995 into 2016.  It was stopped in part due to worsening memory   Review of Systems:  Review of Systems  Genitourinary: Positive for frequency.  Neurological: Negative for tremors and weakness.  Psychiatric/Behavioral: Positive for depression and sleep disturbance. Negative for agitation, behavioral problems,  confusion, decreased concentration, dysphoric mood, hallucinations, self-injury and suicidal ideas. The patient is not nervous/anxious and is not hyperactive.     Medications: I have reviewed the patient's current medications.  Current Outpatient Medications  Medication Sig Dispense Refill  . levocetirizine (XYZAL) 5 MG tablet Take 5 mg by mouth every evening.    Marland Kitchen lisinopril-hydrochlorothiazide (ZESTORETIC) 10-12.5 MG tablet Take 1 tablet by mouth daily.    . metoprolol (LOPRESSOR) 50 MG tablet Take 1 tablet (50 mg total) by mouth 2 (two) times daily. For high blood pressure    . OLANZapine (ZYPREXA) 2.5 MG tablet TAKE 1 & 1/2 TABLETS (3.75 MG TOTAL) BY MOUTH AT BEDTIME. 135 tablet 0  . PARoxetine (PAXIL) 10 MG tablet Take 1 tablet (10 mg total) by mouth daily. 90 tablet 1   No current facility-administered medications for this visit.    Medication Side Effects: OCC urinary problems.  No teeth grinding but moving jaw some  Allergies:  Allergies  Allergen Reactions  . Advair Diskus [Fluticasone-Salmeterol]     continuous coughing    Past Medical History:  Diagnosis Date  . Arthritis   . Asthma    no inhaler use "for months"  . GERD (gastroesophageal reflux disease)   . Hypertension   . Incontinence   . Insomnia 1995  . Urticaria     Family History  Problem Relation Age of Onset  . Hypertension Father   . Arthritis Father   . Glaucoma Father   . Cancer Mother   . Depression Mother   . Anxiety disorder Mother  Social History   Socioeconomic History  . Marital status: Married    Spouse name: Not on file  . Number of children: Not on file  . Years of education: Not on file  . Highest education level: Not on file  Occupational History  . Not on file  Tobacco Use  . Smoking status: Never Smoker  . Smokeless tobacco: Never Used  Substance and Sexual Activity  . Alcohol use: No  . Drug use: No  . Sexual activity: Yes    Birth control/protection:  Post-menopausal  Other Topics Concern  . Not on file  Social History Narrative  . Not on file   Social Determinants of Health   Financial Resource Strain:   . Difficulty of Paying Living Expenses: Not on file  Food Insecurity:   . Worried About Charity fundraiser in the Last Year: Not on file  . Ran Out of Food in the Last Year: Not on file  Transportation Needs:   . Lack of Transportation (Medical): Not on file  . Lack of Transportation (Non-Medical): Not on file  Physical Activity:   . Days of Exercise per Week: Not on file  . Minutes of Exercise per Session: Not on file  Stress:   . Feeling of Stress : Not on file  Social Connections:   . Frequency of Communication with Friends and Family: Not on file  . Frequency of Social Gatherings with Friends and Family: Not on file  . Attends Religious Services: Not on file  . Active Member of Clubs or Organizations: Not on file  . Attends Archivist Meetings: Not on file  . Marital Status: Not on file  Intimate Partner Violence:   . Fear of Current or Ex-Partner: Not on file  . Emotionally Abused: Not on file  . Physically Abused: Not on file  . Sexually Abused: Not on file    Past Medical History, Surgical history, Social history, and Family history were reviewed and updated as appropriate.   Retired from Aeronautical engineer.  Please see review of systems for further details on the patient's review from today.   Objective:   Physical Exam:  There were no vitals taken for this visit.  Physical Exam Constitutional:      General: She is not in acute distress.    Appearance: She is well-developed.  Musculoskeletal:        General: No deformity.  Neurological:     Mental Status: She is alert and oriented to person, place, and time.     Motor: No tremor.     Coordination: Coordination normal.     Gait: Gait normal.     Comments: Very slight side-side jaw movements  Psychiatric:        Attention and Perception:  Attention and perception normal.        Mood and Affect: Mood is not anxious or depressed. Affect is not labile, blunt, angry or inappropriate.        Speech: Speech normal.        Behavior: Behavior normal.        Thought Content: Thought content normal. Thought content does not include homicidal or suicidal ideation. Thought content does not include homicidal or suicidal plan.        Cognition and Memory: Cognition normal.        Judgment: Judgment normal.     Comments: Insight intact. No auditory or visual hallucinations. No delusions.       Lab Review:  Component Value Date/Time   NA 137 05/21/2015 1747   K 4.0 05/21/2015 1747   CL 102 05/21/2015 1747   CO2 30 05/21/2015 1747   GLUCOSE 104 (H) 05/21/2015 1747   BUN 14 05/21/2015 1747   CREATININE 0.75 05/21/2015 1747   CALCIUM 9.4 05/21/2015 1747   PROT 7.8 05/21/2015 1747   ALBUMIN 4.7 05/21/2015 1747   AST 20 05/21/2015 1747   ALT 23 05/21/2015 1747   ALKPHOS 81 05/21/2015 1747   BILITOT 0.5 05/21/2015 1747   GFRNONAA >60 05/21/2015 1747   GFRAA >60 05/21/2015 1747       Component Value Date/Time   WBC 10.4 05/21/2015 1747   RBC 5.07 05/21/2015 1747   HGB 15.0 05/21/2015 1747   HCT 45.5 05/21/2015 1747   PLT 443 (H) 05/21/2015 1747   MCV 89.7 05/21/2015 1747   MCH 29.6 05/21/2015 1747   MCHC 33.0 05/21/2015 1747   RDW 12.4 05/21/2015 1747    No results found for: POCLITH, LITHIUM   No results found for: PHENYTOIN, PHENOBARB, VALPROATE, CBMZ   .res Assessment: Plan:    Syndney was seen today for follow-up, anxiety and depression.  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  She tried multiple medications without sufficient success but has had good results from the  combination of low-dose paroxetine plus olanzapine.  Other dosages have failed to adequately control symptoms.  She tolerates the medication reasonably well with some bruxism but that appears stable.  Chronic insomnia resolved.  Gets yearly physcial for labs.  Discussed potential metabolic side effects associated with atypical antipsychotics, as well as potential risk for movement side effects. Advised pt to contact office if movement side effects occur.   No sig TD but perhaps slight jaw movements noted.  No med changes.    Follow-up 6 months  Lynder Parents, MD, DFAPA  Please see After Visit Summary for patient specific instructions.  No future appointments.  No orders of the defined types were placed in this encounter.     -------------------------------

## 2019-06-23 ENCOUNTER — Other Ambulatory Visit: Payer: Self-pay | Admitting: Family Medicine

## 2019-06-23 DIAGNOSIS — R7401 Elevation of levels of liver transaminase levels: Secondary | ICD-10-CM

## 2019-07-07 ENCOUNTER — Ambulatory Visit
Admission: RE | Admit: 2019-07-07 | Discharge: 2019-07-07 | Disposition: A | Discharge status: Home / Self Care | Payer: BC Managed Care – PPO | Source: Ambulatory Visit | Attending: Family Medicine | Admitting: Family Medicine

## 2019-07-07 DIAGNOSIS — R7401 Elevation of levels of liver transaminase levels: Secondary | ICD-10-CM

## 2019-07-26 ENCOUNTER — Other Ambulatory Visit: Payer: Self-pay | Admitting: Psychiatry

## 2019-09-26 ENCOUNTER — Ambulatory Visit: Payer: BC Managed Care – PPO | Attending: Internal Medicine

## 2019-09-26 DIAGNOSIS — Z23 Encounter for immunization: Secondary | ICD-10-CM

## 2019-09-26 NOTE — Progress Notes (Signed)
   Covid-19 Vaccination Clinic  Name:  Cambreigh Crisafi    MRN: [id number] DOB: Aug 26, 1955  09/26/2019  Ms. Croft was observed post Covid-19 immunization for 15 minutes without incident. She was provided with Vaccine Information Sheet and instruction to access the V-Safe system.   Ms. Bini was instructed to call 911 with any severe reactions post vaccine: Marland Kitchen Difficulty breathing  . Swelling of face and throat  . A fast heartbeat  . A bad rash all over body  . Dizziness and weakness   Immunizations Administered    Name Date Dose VIS Date Route   Pfizer COVID-19 Vaccine 09/26/2019  3:02 PM 0.3 mL 06/06/2019 Intramuscular   Manufacturer: [employer], [employer]   Lot: [id number]   [place]: [id number]

## 2019-10-21 ENCOUNTER — Ambulatory Visit: Payer: BC Managed Care – PPO | Attending: Internal Medicine

## 2019-10-21 DIAGNOSIS — Z23 Encounter for immunization: Secondary | ICD-10-CM

## 2019-10-21 NOTE — Progress Notes (Signed)
   Covid-19 Vaccination Clinic  Name:  Megan [name]    MRN: [id number] DOB: 1956-01-17  10/21/2019  [name] was observed post Covid-19 immunization for 15 minutes without incident. She was provided with Vaccine Information Sheet and instruction to access the V-Safe system.   [name] was instructed to call 911 with any severe reactions post vaccine: Marland Kitchen Difficulty breathing  . Swelling of face and throat  . A fast heartbeat  . A bad rash all over body  . Dizziness and weakness   Immunizations Administered    Name Date Dose VIS Date Route   Pfizer COVID-19 Vaccine 10/21/2019  2:58 PM 0.3 mL 08/20/2018 Intramuscular   Manufacturer: [place]   Lot: [id number]   [place]: [id number]

## 2019-11-25 ENCOUNTER — Other Ambulatory Visit: Payer: Self-pay | Admitting: Psychiatry

## 2019-12-16 ENCOUNTER — Other Ambulatory Visit: Payer: Self-pay

## 2019-12-16 ENCOUNTER — Encounter: Payer: Self-pay | Admitting: Psychiatry

## 2019-12-16 ENCOUNTER — Ambulatory Visit (INDEPENDENT_AMBULATORY_CARE_PROVIDER_SITE_OTHER): Payer: BC Managed Care – PPO | Admitting: Psychiatry

## 2019-12-16 DIAGNOSIS — G4721 Circadian rhythm sleep disorder, delayed sleep phase type: Secondary | ICD-10-CM | POA: Diagnosis not present

## 2019-12-16 DIAGNOSIS — F409 Phobic anxiety disorder, unspecified: Secondary | ICD-10-CM

## 2019-12-16 DIAGNOSIS — F5105 Insomnia due to other mental disorder: Secondary | ICD-10-CM

## 2019-12-16 DIAGNOSIS — F411 Generalized anxiety disorder: Secondary | ICD-10-CM

## 2019-12-16 MED ORDER — OLANZAPINE 7.5 MG PO TABS: 3.7500 mg | ORAL_TABLET | Freq: Every day | ORAL | 1 refills | Status: DC

## 2019-12-16 MED ORDER — PAROXETINE HCL 10 MG PO TABS: 10.0000 mg | ORAL_TABLET | Freq: Every day | ORAL | 1 refills | Status: DC

## 2019-12-16 NOTE — Progress Notes (Signed)
Megan [name] [id number] 01-25-1956 64 y.o.  Subjective:   Patient ID:  Megan [name] is a 64 y.o. (DOB 1956/04/17) female.  Chief Complaint:  Chief Complaint  Patient presents with  . Follow-up    Anxiety, depression, and sleep    Anxiety Patient reports no confusion, decreased concentration, nervous/anxious behavior or suicidal ideas.    Depression        Associated symptoms include no decreased concentration and no suicidal ideas.  Past medical history includes anxiety.    Megan [name] presents to the office today for follow-up of anxiety and insomnia chronic.  Last seen JDecember 2020.  No meds were changed.  Paroxetine 10 mg daily, olanzapine 3.75 mg daily.  Fine without significant changes.  No new concerns except bladder issues.  She thinks it's paroxetine.   Has been healthy except BP problems.    No sign anxiety.  Sleep is still a struggle for her. Takes longer to fall asleep than she wants.  Goes to bed 4 AM and sleeps 1030.  Doesn't feel tired.   Takes a couple of hours to fall asleep sometimes. Average to sleep at 2 AM and awaken usually 11:30.  Usually sleeps pretty well.  Enjoys TV and pets. D in [place] and doing well.  Not doing anything much.    Patient reports stable mood and denies depressed or irritable moods.  Patient denies any recent difficulty with anxiety.   Denies appetite disturbance.  Patient reports that energy and motivation have been good.  Patient denies any difficulty with concentration.  Patient denies any suicidal ideation.  Prior psychiatric medication failures include mirtazapine, nortriptyline, Wellbutrin, Prozac with side effects, Seroquel, and Xanax and lorazepam and amitriptyline 75 mg.  Paroxetine  Olanzapine 3.75 mg daily  She had taken amitriptyline from 1995 into 2016.  It was stopped in part due to worsening memory   Review of Systems:  Review of Systems  Genitourinary: Positive for frequency.  Neurological: Negative for tremors and  weakness.  Psychiatric/Behavioral: Positive for depression and sleep disturbance. Negative for agitation, behavioral problems, confusion, decreased concentration, dysphoric mood, hallucinations, self-injury and suicidal ideas. The patient is not nervous/anxious and is not hyperactive.     Medications: I have reviewed the patient's current medications.  Current Outpatient Medications  Medication Sig Dispense Refill  . levocetirizine (XYZAL) 5 MG tablet Take 5 mg by mouth every evening.    Marland Kitchen lisinopril-hydrochlorothiazide (ZESTORETIC) 10-12.5 MG tablet Take 1 tablet by mouth daily.    . metoprolol (LOPRESSOR) 50 MG tablet Take 1 tablet (50 mg total) by mouth 2 (two) times daily. For high blood pressure    . OLANZapine (ZYPREXA) 7.5 MG tablet Take 0.5 tablets (3.75 mg total) by mouth at bedtime. 45 tablet 1  . PARoxetine (PAXIL) 10 MG tablet Take 1 tablet (10 mg total) by mouth daily. 90 tablet 1   No current facility-administered medications for this visit.    Medication Side Effects: OCC urinary problems.  No teeth grinding but moving jaw some  Allergies:  Allergies  Allergen Reactions  . Advair Diskus [Fluticasone-Salmeterol]     continuous coughing    Past Medical History:  Diagnosis Date  . Arthritis   . Asthma    no inhaler use "for months"  . GERD (gastroesophageal reflux disease)   . Hypertension   . Incontinence   . Insomnia 1995  . Urticaria     Family History  Problem Relation Age of Onset  . Hypertension Father   .  Arthritis Father   . Glaucoma Father   . Cancer Mother   . Depression Mother   . Anxiety disorder Mother     Social History   Socioeconomic History  . Marital status: Married    Spouse name: Not on file  . Number of children: Not on file  . Years of education: Not on file  . Highest education level: Not on file  Occupational History  . Not on file  Tobacco Use  . Smoking status: Never Smoker  . Smokeless tobacco: Never Used  Substance  and Sexual Activity  . Alcohol use: No  . Drug use: No  . Sexual activity: Yes    Birth control/protection: Post-menopausal  Other Topics Concern  . Not on file  Social History Narrative  . Not on file   Social Determinants of Health   Financial Resource Strain:   . Difficulty of Paying Living Expenses:   Food Insecurity:   . Worried About Charity fundraiser in the Last Year:   . Arboriculturist in the Last Year:   Transportation Needs:   . Film/video editor (Medical):   Marland Kitchen Lack of Transportation (Non-Medical):   Physical Activity:   . Days of Exercise per Week:   . Minutes of Exercise per Session:   Stress:   . Feeling of Stress :   Social Connections:   . Frequency of Communication with Friends and Family:   . Frequency of Social Gatherings with Friends and Family:   . Attends Religious Services:   . Active Member of Clubs or Organizations:   . Attends Archivist Meetings:   Marland Kitchen Marital Status:   Intimate Partner Violence:   . Fear of Current or Ex-Partner:   . Emotionally Abused:   Marland Kitchen Physically Abused:   . Sexually Abused:     Past Medical History, Surgical history, Social history, and Family history were reviewed and updated as appropriate.   Retired from Aeronautical engineer.  Please see review of systems for further details on the patient's review from today.   Objective:   Physical Exam:  There were no vitals taken for this visit.  Physical Exam Constitutional:      General: She is not in acute distress.    Appearance: She is well-developed.  Musculoskeletal:        General: No deformity.  Neurological:     Mental Status: She is alert and oriented to person, place, and time.     Motor: No tremor.     Coordination: Coordination normal.     Gait: Gait normal.     Comments: Very slight side-side jaw movements  Psychiatric:        Attention and Perception: Attention and perception normal.        Mood and Affect: Mood is anxious. Mood is not  depressed. Affect is not labile, blunt, angry or inappropriate.        Speech: Speech normal.        Behavior: Behavior normal.        Thought Content: Thought content normal. Thought content does not include homicidal or suicidal ideation. Thought content does not include homicidal or suicidal plan.        Cognition and Memory: Cognition normal.        Judgment: Judgment normal.     Comments: Insight intact. No auditory or visual hallucinations. No delusions.  Anxious if can't sleep.     Lab Review:     Component Value  Date/Time   NA 137 05/21/2015 1747   K 4.0 05/21/2015 1747   CL 102 05/21/2015 1747   CO2 30 05/21/2015 1747   GLUCOSE 104 (H) 05/21/2015 1747   BUN 14 05/21/2015 1747   CREATININE 0.75 05/21/2015 1747   CALCIUM 9.4 05/21/2015 1747   PROT 7.8 05/21/2015 1747   ALBUMIN 4.7 05/21/2015 1747   AST 20 05/21/2015 1747   ALT 23 05/21/2015 1747   ALKPHOS 81 05/21/2015 1747   BILITOT 0.5 05/21/2015 1747   GFRNONAA >60 05/21/2015 1747   GFRAA >60 05/21/2015 1747       Component Value Date/Time   WBC 10.4 05/21/2015 1747   RBC 5.07 05/21/2015 1747   HGB 15.0 05/21/2015 1747   HCT 45.5 05/21/2015 1747   PLT 443 (H) 05/21/2015 1747   MCV 89.7 05/21/2015 1747   MCH 29.6 05/21/2015 1747   MCHC 33.0 05/21/2015 1747   RDW 12.4 05/21/2015 1747    No results found for: POCLITH, LITHIUM   No results found for: PHENYTOIN, PHENOBARB, VALPROATE, CBMZ   .res Assessment: Plan:    Megan [name] was seen today for follow-up.  Diagnoses and all orders for this visit:  Generalized anxiety disorder -     PARoxetine (PAXIL) 10 MG tablet; Take 1 tablet (10 mg total) by mouth daily. -     OLANZapine (ZYPREXA) 7.5 MG tablet; Take 0.5 tablets (3.75 mg total) by mouth at bedtime.  Delayed sleep phase syndrome -     PARoxetine (PAXIL) 10 MG tablet; Take 1 tablet (10 mg total) by mouth daily. -     OLANZapine (ZYPREXA) 7.5 MG tablet; Take 0.5 tablets (3.75 mg total) by mouth at  bedtime.  Insomnia due to mental condition -     OLANZapine (ZYPREXA) 7.5 MG tablet; Take 0.5 tablets (3.75 mg total) by mouth at bedtime.  Phobic disorder -     OLANZapine (ZYPREXA) 7.5 MG tablet; Take 0.5 tablets (3.75 mg total) by mouth at bedtime.   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  She tried multiple medications without sufficient success but has had good results from the combination of low-dose paroxetine plus olanzapine.  Other dosages have failed to adequately control symptoms.  She tolerates the medication reasonably well with some bruxism but that appears stable.  Not likely to get benefit below this dose of olanzapine.  Chronic insomnia managed but delayed cycle.  She's still phobic about not sleeping.  Unlikely that bladder problem is related to paroxetine but cannot rule it out.  Gets yearly physcial for labs.  Discussed potential metabolic side effects associated with atypical antipsychotics, as well as potential risk for movement side effects. Advised pt to contact office if movement side effects occur.   No sig TD but perhaps slight jaw movements noted.  Disc SE concerns.  No med changes.    Follow-up 6 months  Lynder Parents, MD, DFAPA  Please see After Visit Summary for patient specific instructions.  No future appointments.  No orders of the defined types were placed in this encounter.     -------------------------------

## 2020-03-11 ENCOUNTER — Other Ambulatory Visit: Payer: Self-pay | Admitting: Family Medicine

## 2020-03-11 DIAGNOSIS — Z1231 Encounter for screening mammogram for malignant neoplasm of breast: Secondary | ICD-10-CM

## 2020-03-24 ENCOUNTER — Ambulatory Visit
Admission: RE | Admit: 2020-03-24 | Discharge: 2020-03-24 | Disposition: A | Discharge status: Home / Self Care | Payer: BC Managed Care – PPO | Source: Ambulatory Visit | Attending: Family Medicine | Admitting: Family Medicine

## 2020-03-24 ENCOUNTER — Other Ambulatory Visit: Payer: Self-pay

## 2020-03-24 DIAGNOSIS — Z1231 Encounter for screening mammogram for malignant neoplasm of breast: Secondary | ICD-10-CM

## 2020-04-24 ENCOUNTER — Ambulatory Visit: Payer: BC Managed Care – PPO | Attending: Internal Medicine

## 2020-04-24 DIAGNOSIS — Z23 Encounter for immunization: Secondary | ICD-10-CM

## 2020-04-24 NOTE — Progress Notes (Signed)
   Covid-19 Vaccination Clinic  Name:  Megan [name]    MRN: [id number] DOB: Nov 02, 1955  04/24/2020  [name] was observed post Covid-19 immunization for 15 minutes without incident. She was provided with Vaccine Information Sheet and instruction to access the V-Safe system.   [name] was instructed to call 911 with any severe reactions post vaccine: Marland Kitchen Difficulty breathing  . Swelling of face and throat  . A fast heartbeat  . A bad rash all over body  . Dizziness and weakness

## 2020-06-15 ENCOUNTER — Other Ambulatory Visit: Payer: Self-pay

## 2020-06-15 ENCOUNTER — Ambulatory Visit (INDEPENDENT_AMBULATORY_CARE_PROVIDER_SITE_OTHER): Payer: BC Managed Care – PPO | Admitting: Psychiatry

## 2020-06-15 ENCOUNTER — Encounter: Payer: Self-pay | Admitting: Psychiatry

## 2020-06-15 DIAGNOSIS — G2581 Restless legs syndrome: Secondary | ICD-10-CM | POA: Diagnosis not present

## 2020-06-15 DIAGNOSIS — G4721 Circadian rhythm sleep disorder, delayed sleep phase type: Secondary | ICD-10-CM

## 2020-06-15 DIAGNOSIS — F5105 Insomnia due to other mental disorder: Secondary | ICD-10-CM | POA: Diagnosis not present

## 2020-06-15 DIAGNOSIS — F411 Generalized anxiety disorder: Secondary | ICD-10-CM

## 2020-06-15 DIAGNOSIS — F409 Phobic anxiety disorder, unspecified: Secondary | ICD-10-CM

## 2020-06-15 MED ORDER — PAROXETINE HCL 10 MG PO TABS: 10.0000 mg | ORAL_TABLET | Freq: Every day | ORAL | 1 refills | Status: DC

## 2020-06-15 MED ORDER — OLANZAPINE 7.5 MG PO TABS: 3.7500 mg | ORAL_TABLET | Freq: Every day | ORAL | 1 refills | Status: DC

## 2020-06-15 NOTE — Progress Notes (Signed)
Megan [name] [id number] 22-Oct-1955 64 y.o.  Subjective:   Patient ID:  Megan [name] is a 64 y.o. (DOB 10/29/1955) female.  Chief Complaint:  Chief Complaint  Patient presents with  . Follow-up  . Anxiety  . Depression  . Sleeping Problem    Anxiety Patient reports no confusion, decreased concentration, nervous/anxious behavior or suicidal ideas.    Depression        Associated symptoms include no decreased concentration and no suicidal ideas.  Past medical history includes anxiety.    Megan [name] presents to the office today for follow-up of anxiety and insomnia chronic.  seen JDecember 2020.  No meds were changed.  Paroxetine 10 mg daily, olanzapine 3.75 mg daily.  12/16/19 appt without med changes and following noted: Fine without significant changes.  No new concerns except bladder issues.  She thinks it's paroxetine.   Has been healthy except BP problems.    No sign anxiety.  Sleep is still a struggle for her. Takes longer to fall asleep than she wants.  Goes to bed 4 AM and sleeps 1030.  Doesn't feel tired.   Takes a couple of hours to fall asleep sometimes. Average to sleep at 2 AM and awaken usually 11:30.  Usually sleeps pretty well.  06/15/2020 appointment with following noted: Goes to [employer]. Will cook Christmas day.  Cleaning and shopping. Doing fine except feeling restless some when sitting still in the evening.  Doesn't affect her when she lies down to go to bed..  Affects her before evening meds. PE in Feb upcoming.  Never had low iron. Only needs 5  1/2 hours at night.  Goes to bed late and gets up late.    Enjoys TV and pets. D in [place] and doing well.  Not doing anything much.    Patient reports stable mood and denies depressed or irritable moods.  Patient denies any recent difficulty with anxiety.   Denies appetite disturbance.  Patient reports that energy and motivation have been good.  Patient denies any difficulty with concentration.  Patient  denies any suicidal ideation.  Prior psychiatric medication failures include mirtazapine, nortriptyline, Wellbutrin, Prozac with side effects, Seroquel, and Xanax and lorazepam and amitriptyline 75 mg.  Paroxetine  Olanzapine 3.75 mg daily  She had taken amitriptyline from 1995 into 2016.  It was stopped in part due to worsening memory   Review of Systems:  Review of Systems  Genitourinary: Positive for frequency.  Neurological: Negative for tremors and weakness.       Restless  Psychiatric/Behavioral: Positive for depression and sleep disturbance. Negative for agitation, behavioral problems, confusion, decreased concentration, dysphoric mood, hallucinations, self-injury and suicidal ideas. The patient is not nervous/anxious and is not hyperactive.     Medications: I have reviewed the patient's current medications.  Current Outpatient Medications  Medication Sig Dispense Refill  . levocetirizine (XYZAL) 5 MG tablet Take 5 mg by mouth every evening.    Marland Kitchen lisinopril-hydrochlorothiazide (ZESTORETIC) 10-12.5 MG tablet Take 1 tablet by mouth daily.    . metoprolol (LOPRESSOR) 50 MG tablet Take 1 tablet (50 mg total) by mouth 2 (two) times daily. For high blood pressure    . OLANZapine (ZYPREXA) 7.5 MG tablet Take 0.5 tablets (3.75 mg total) by mouth at bedtime. 45 tablet 1  . PARoxetine (PAXIL) 10 MG tablet Take 1 tablet (10 mg total) by mouth daily. 90 tablet 1   No current facility-administered medications for this visit.    Medication Side Effects:  OCC urinary problems.  No teeth grinding but moving jaw some  Allergies:  Allergies  Allergen Reactions  . Advair Diskus [Fluticasone-Salmeterol]     continuous coughing    Past Medical History:  Diagnosis Date  . Arthritis   . Asthma    no inhaler use "for months"  . GERD (gastroesophageal reflux disease)   . Hypertension   . Incontinence   . Insomnia 1995  . Urticaria     Family History  Problem Relation Age of Onset  .  Hypertension Father   . Arthritis Father   . Glaucoma Father   . Cancer Mother   . Depression Mother   . Anxiety disorder Mother     Social History   Socioeconomic History  . Marital status: Married    Spouse name: Not on file  . Number of children: Not on file  . Years of education: Not on file  . Highest education level: Not on file  Occupational History  . Not on file  Tobacco Use  . Smoking status: Never Smoker  . Smokeless tobacco: Never Used  Substance and Sexual Activity  . Alcohol use: No  . Drug use: No  . Sexual activity: Yes    Birth control/protection: Post-menopausal  Other Topics Concern  . Not on file  Social History Narrative  . Not on file   Social Determinants of Health   Financial Resource Strain: Not on file  Food Insecurity: Not on file  Transportation Needs: Not on file  Physical Activity: Not on file  Stress: Not on file  Social Connections: Not on file  Intimate Partner Violence: Not on file    Past Medical History, Surgical history, Social history, and Family history were reviewed and updated as appropriate.   Retired from Associate Professorasst teacher.  Please see review of systems for further details on the patient's review from today.   Objective:   Physical Exam:  There were no vitals taken for this visit.  Physical Exam Constitutional:      General: She is not in acute distress.    Appearance: She is well-developed.  Musculoskeletal:        General: No deformity.  Neurological:     Mental Status: She is alert and oriented to person, place, and time.     Motor: No tremor.     Coordination: Coordination normal.     Gait: Gait normal.     Comments: Very slight side-side jaw movements  Psychiatric:        Attention and Perception: Attention and perception normal.        Mood and Affect: Mood is anxious. Mood is not depressed. Affect is not labile, blunt, angry or inappropriate.        Speech: Speech normal.        Behavior: Behavior  normal.        Thought Content: Thought content normal. Thought content does not include homicidal or suicidal ideation. Thought content does not include homicidal or suicidal plan.        Cognition and Memory: Cognition normal.        Judgment: Judgment normal.     Comments: Insight intact. No auditory or visual hallucinations. No delusions.  Anxious about sleep is better     Lab Review:     Component Value Date/Time   NA 137 05/21/2015 1747   K 4.0 05/21/2015 1747   CL 102 05/21/2015 1747   CO2 30 05/21/2015 1747   GLUCOSE 104 (H) 05/21/2015 1747  BUN 14 05/21/2015 1747   CREATININE 0.75 05/21/2015 1747   CALCIUM 9.4 05/21/2015 1747   PROT 7.8 05/21/2015 1747   ALBUMIN 4.7 05/21/2015 1747   AST 20 05/21/2015 1747   ALT 23 05/21/2015 1747   ALKPHOS 81 05/21/2015 1747   BILITOT 0.5 05/21/2015 1747   GFRNONAA >60 05/21/2015 1747   GFRAA >60 05/21/2015 1747       Component Value Date/Time   WBC 10.4 05/21/2015 1747   RBC 5.07 05/21/2015 1747   HGB 15.0 05/21/2015 1747   HCT 45.5 05/21/2015 1747   PLT 443 (H) 05/21/2015 1747   MCV 89.7 05/21/2015 1747   MCH 29.6 05/21/2015 1747   MCHC 33.0 05/21/2015 1747   RDW 12.4 05/21/2015 1747    No results found for: POCLITH, LITHIUM   No results found for: PHENYTOIN, PHENOBARB, VALPROATE, CBMZ   .res Assessment: Plan:    Chandler was seen today for follow-up, anxiety, depression and sleeping problem.  Diagnoses and all orders for this visit:  Generalized anxiety disorder -     PARoxetine (PAXIL) 10 MG tablet; Take 1 tablet (10 mg total) by mouth daily. -     OLANZapine (ZYPREXA) 7.5 MG tablet; Take 0.5 tablets (3.75 mg total) by mouth at bedtime.  Restless legs syndrome  Delayed sleep phase syndrome -     PARoxetine (PAXIL) 10 MG tablet; Take 1 tablet (10 mg total) by mouth daily. -     OLANZapine (ZYPREXA) 7.5 MG tablet; Take 0.5 tablets (3.75 mg total) by mouth at bedtime.  Insomnia due to mental condition -      OLANZapine (ZYPREXA) 7.5 MG tablet; Take 0.5 tablets (3.75 mg total) by mouth at bedtime.  Phobic disorder -     OLANZapine (ZYPREXA) 7.5 MG tablet; Take 0.5 tablets (3.75 mg total) by mouth at bedtime.   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  She tried multiple medications without sufficient success but has had good results from the combination of low-dose paroxetine plus olanzapine.  Other dosages have failed to adequately control symptoms.  She tolerates the medication reasonably well with some bruxism but that appears stable.  Not likely to get benefit below this dose of olanzapine.  Extensive discussion of RLS.  Rec check iron.  She doesn't want to do anything before the PCP eval in Feb.  Timing does not correlate with taking olanzapine.  Chronic insomnia managed but delayed cycle.  She's still phobic about not sleeping.  Unlikely that bladder problem is related to paroxetine but cannot rule it out.  Gets yearly physcial for labs.  Discussed potential metabolic side effects associated with atypical antipsychotics, as well as potential risk for movement side effects. Advised pt to contact office if movement side effects occur.   No sig TD but perhaps slight jaw movements noted.  Disc SE concerns.  No med changes.    Follow-up 6 months  Lynder Parents, MD, DFAPA  Please see After Visit Summary for patient specific instructions.  Future Appointments  Date Time Provider [place]  12/21/2020  2:30 PM Cottle, Billey Co., MD [facility] None    No orders of the defined types were placed in this encounter.     -------------------------------

## 2020-09-30 ENCOUNTER — Other Ambulatory Visit: Payer: Self-pay | Admitting: Psychiatry

## 2020-09-30 DIAGNOSIS — F411 Generalized anxiety disorder: Secondary | ICD-10-CM

## 2020-09-30 DIAGNOSIS — G4721 Circadian rhythm sleep disorder, delayed sleep phase type: Secondary | ICD-10-CM

## 2020-10-12 ENCOUNTER — Other Ambulatory Visit (HOSPITAL_BASED_OUTPATIENT_CLINIC_OR_DEPARTMENT_OTHER): Payer: Self-pay

## 2020-10-13 ENCOUNTER — Ambulatory Visit: Payer: BC Managed Care – PPO | Attending: Internal Medicine

## 2020-10-13 ENCOUNTER — Other Ambulatory Visit (HOSPITAL_BASED_OUTPATIENT_CLINIC_OR_DEPARTMENT_OTHER): Payer: Self-pay

## 2020-10-13 ENCOUNTER — Other Ambulatory Visit: Payer: Self-pay

## 2020-10-13 DIAGNOSIS — Z23 Encounter for immunization: Secondary | ICD-10-CM

## 2020-10-13 MED ORDER — COVID-19 MRNA VAC-TRIS(PFIZER) 30 MCG/0.3ML IM SUSP
INTRAMUSCULAR | 0 refills | Status: AC
  Filled 2020-10-13: qty 0.3, 1d supply, fill #0

## 2020-10-13 NOTE — Progress Notes (Signed)
   Covid-19 Vaccination Clinic  Name:  Megan [name]    MRN: [id number] DOB: 1955-07-17  10/13/2020  Megan [name] was observed post Covid-19 immunization for 15 minutes without incident. She was provided with Vaccine Information Sheet and instruction to access the V-Safe system.   Megan [name] was instructed to call 911 with any severe reactions post vaccine: Marland Kitchen Difficulty breathing  . Swelling of face and throat  . A fast heartbeat  . A bad rash all over body  . Dizziness and weakness   Immunizations Administered    Name Date Dose VIS Date Route   PFIZER Comrnaty([place]) Covid-19 Vaccine 10/13/2020  2:34 PM 0.3 mL 06/03/2020 Intramuscular   Manufacturer: [place]   Lot: [id number]   NDC: [phone]

## 2020-12-11 ENCOUNTER — Other Ambulatory Visit: Payer: Self-pay | Admitting: Psychiatry

## 2020-12-11 DIAGNOSIS — F411 Generalized anxiety disorder: Secondary | ICD-10-CM

## 2020-12-11 DIAGNOSIS — G4721 Circadian rhythm sleep disorder, delayed sleep phase type: Secondary | ICD-10-CM

## 2020-12-21 ENCOUNTER — Ambulatory Visit: Payer: BC Managed Care – PPO | Admitting: Psychiatry

## 2020-12-21 ENCOUNTER — Other Ambulatory Visit: Payer: Self-pay

## 2020-12-21 ENCOUNTER — Encounter: Payer: Self-pay | Admitting: Psychiatry

## 2020-12-21 DIAGNOSIS — F5105 Insomnia due to other mental disorder: Secondary | ICD-10-CM

## 2020-12-21 DIAGNOSIS — F411 Generalized anxiety disorder: Secondary | ICD-10-CM

## 2020-12-21 DIAGNOSIS — G2581 Restless legs syndrome: Secondary | ICD-10-CM

## 2020-12-21 DIAGNOSIS — F409 Phobic anxiety disorder, unspecified: Secondary | ICD-10-CM | POA: Diagnosis not present

## 2020-12-21 DIAGNOSIS — G4721 Circadian rhythm sleep disorder, delayed sleep phase type: Secondary | ICD-10-CM

## 2020-12-21 MED ORDER — OLANZAPINE 7.5 MG PO TABS: 3.7500 mg | ORAL_TABLET | Freq: Every day | ORAL | 1 refills | Status: DC

## 2020-12-21 MED ORDER — PAROXETINE HCL 10 MG PO TABS: 10.0000 mg | ORAL_TABLET | Freq: Every day | ORAL | 1 refills | Status: DC

## 2020-12-21 NOTE — Progress Notes (Signed)
Megan [name] [id number] 07/14/1955 65 y.o.  Subjective:   Patient ID:  Megan [name] is a 65 y.o. (DOB [birth year]) female.  Chief Complaint:  Chief Complaint  Patient presents with   Follow-up   Anxiety   Depression   Sleeping Problem    Anxiety Patient reports no confusion, decreased concentration, nervous/anxious behavior or suicidal ideas.    Depression        Associated symptoms include no decreased concentration and no suicidal ideas.  Past medical history includes anxiety.   Megan [name] presents to the office today for follow-up of anxiety and insomnia chronic.  seen JDecember 2020.  No meds were changed.  Paroxetine 10 mg daily, olanzapine 3.75 mg daily.  12/16/19 appt without med changes and following noted: Fine without significant changes.  No new concerns except bladder issues.  Megan [name] thinks it's paroxetine.   Has been healthy except BP problems.    No sign anxiety.  Sleep is still a struggle for her. Takes longer to fall asleep than Megan [name] wants.  Goes to bed 4 AM and sleeps 1030.  Doesn't feel tired.   Takes a couple of hours to fall asleep sometimes. Average to sleep at 2 AM and awaken usually 11:30.  Usually sleeps pretty well.  06/15/2020 appointment with following noted: Goes to [employer]. Will cook Christmas day.  Cleaning and shopping. Doing fine except feeling restless some when sitting still in the evening.  Doesn't affect her when Megan [name] lies down to go to bed..  Affects her before evening meds. PE in 08-22-22 upcoming.  Never had low iron. Only needs 5  1/2 hours at night.  Goes to bed late and gets up late.   Plan: No med changes  12/21/2020 appointment with the following noted: F died in 2022/08/22 at 65 yo.  Was local. Doing really well with depression and anxiety. Sleep is generally fine but pattern 3-11AM.  Still takes awhile. Takes olanzapine at [place]. SE mild jaw movement and urinary problems with urgency from paroxetine. No RLS when sleeping.  Moves legs a  lot on the sofa.  Mildly uncomfortable.  No med change desired.  Enjoys TV and pets. D in [place] and doing well.  Not doing anything much.    Patient reports stable mood and denies depressed or irritable moods.  Patient denies any recent difficulty with anxiety.   Denies appetite disturbance.  Patient reports that energy and motivation have been good.  Patient denies any difficulty with concentration.  Patient denies any suicidal ideation.  Prior psychiatric medication failures include mirtazapine, nortriptyline, Wellbutrin, Prozac with side effects, Seroquel, and Xanax and lorazepam and amitriptyline 75 mg.  Paroxetine  Olanzapine 3.75 mg daily  Megan [name] had taken amitriptyline from 1995 into 2016.  It was stopped in part due to worsening memory   Review of Systems:  Review of Systems  Genitourinary:  Positive for frequency and urgency.  Neurological:  Negative for tremors and weakness.       Restless  Psychiatric/Behavioral:  Positive for sleep disturbance. Negative for agitation, behavioral problems, confusion, decreased concentration, dysphoric mood, hallucinations, self-injury and suicidal ideas. The patient is not nervous/anxious and is not hyperactive.    Medications: I have reviewed the patient's current medications.  Current Outpatient Medications  Medication Sig Dispense Refill   COVID-19 mRNA Vac-TriS, Pfizer, SUSP injection Inject into the muscle. 0.3 mL 0   levocetirizine (XYZAL) 5 MG tablet Take 5 mg by mouth every evening.     lisinopril-hydrochlorothiazide (ZESTORETIC)  10-12.5 MG tablet Take 1 tablet by mouth daily.     metoprolol (LOPRESSOR) 50 MG tablet Take 1 tablet (50 mg total) by mouth 2 (two) times daily. For high blood pressure     OLANZapine (ZYPREXA) 7.5 MG tablet Take 0.5 tablets (3.75 mg total) by mouth at bedtime. 45 tablet 1   PARoxetine (PAXIL) 10 MG tablet Take 1 tablet (10 mg total) by mouth daily. 90 tablet 1   No current facility-administered medications for  this visit.    Medication Side Effects: OCC urinary problems.  No teeth grinding but moving jaw some  Allergies:  Allergies  Allergen Reactions   Advair Diskus [Fluticasone-Salmeterol]     continuous coughing    Past Medical History:  Diagnosis Date   Arthritis    Asthma    no inhaler use "for months"   GERD (gastroesophageal reflux disease)    Hypertension    Incontinence    Insomnia 1995   Urticaria     Family History  Problem Relation Age of Onset   Hypertension Father    Arthritis Father    Glaucoma Father    Cancer Mother    Depression Mother    Anxiety disorder Mother     Social History   Socioeconomic History   Marital status: Married    Spouse name: Not on file   Number of children: Not on file   Years of education: Not on file   Highest education level: Not on file  Occupational History   Not on file  Tobacco Use   Smoking status: Never   Smokeless tobacco: Never  Substance and Sexual Activity   Alcohol use: No   Drug use: No   Sexual activity: Yes    Birth control/protection: Post-menopausal  Other Topics Concern   Not on file  Social History Narrative   Not on file   Social Determinants of Health   Financial Resource Strain: Not on file  Food Insecurity: Not on file  Transportation Needs: Not on file  Physical Activity: Not on file  Stress: Not on file  Social Connections: Not on file  Intimate Partner Violence: Not on file    Past Medical History, Surgical history, Social history, and Family history were reviewed and updated as appropriate.   Retired from Aeronautical engineer.  Please see review of systems for further details on the patient's review from today.   Objective:   Physical Exam:  There were no vitals taken for this visit.  Physical Exam Constitutional:      General: Megan [name] is not in acute distress.    Appearance: Megan [name] is well-developed.  Musculoskeletal:        General: No deformity.  Neurological:     Mental Status:  Megan [name] is alert and oriented to person, place, and time.     Motor: No tremor.     Coordination: Coordination normal.     Gait: Gait normal.     Comments: Very slight side-side jaw movements  Psychiatric:        Attention and Perception: Attention and perception normal.        Mood and Affect: Mood is not anxious or depressed. Affect is not labile, blunt, angry or inappropriate.        Speech: Speech normal.        Behavior: Behavior normal.        Thought Content: Thought content normal. Thought content does not include homicidal or suicidal ideation. Thought content does not include homicidal or suicidal  plan.        Cognition and Memory: Cognition normal.        Judgment: Judgment normal.     Comments: Insight intact. No auditory or visual hallucinations. No delusions.  Anxious about sleep is better    Lab Review:     Component Value Date/Time   NA 137 05/21/2015 1747   K 4.0 05/21/2015 1747   CL 102 05/21/2015 1747   CO2 30 05/21/2015 1747   GLUCOSE 104 (H) 05/21/2015 1747   BUN 14 05/21/2015 1747   CREATININE 0.75 05/21/2015 1747   CALCIUM 9.4 05/21/2015 1747   PROT 7.8 05/21/2015 1747   ALBUMIN 4.7 05/21/2015 1747   AST 20 05/21/2015 1747   ALT 23 05/21/2015 1747   ALKPHOS 81 05/21/2015 1747   BILITOT 0.5 05/21/2015 1747   GFRNONAA >60 05/21/2015 1747   GFRAA >60 05/21/2015 1747       Component Value Date/Time   WBC 10.4 05/21/2015 1747   RBC 5.07 05/21/2015 1747   HGB 15.0 05/21/2015 1747   HCT 45.5 05/21/2015 1747   PLT 443 (H) 05/21/2015 1747   MCV 89.7 05/21/2015 1747   MCH 29.6 05/21/2015 1747   MCHC 33.0 05/21/2015 1747   RDW 12.4 05/21/2015 1747    No results found for: POCLITH, LITHIUM   No results found for: PHENYTOIN, PHENOBARB, VALPROATE, CBMZ   .res Assessment: Plan:    Aarian was seen today for follow-up, anxiety, depression and sleeping problem.  Diagnoses and all orders for this visit:  Generalized anxiety disorder -     OLANZapine  (ZYPREXA) 7.5 MG tablet; Take 0.5 tablets (3.75 mg total) by mouth at bedtime. -     PARoxetine (PAXIL) 10 MG tablet; Take 1 tablet (10 mg total) by mouth daily.  Phobic disorder -     OLANZapine (ZYPREXA) 7.5 MG tablet; Take 0.5 tablets (3.75 mg total) by mouth at bedtime.  Insomnia due to mental condition -     OLANZapine (ZYPREXA) 7.5 MG tablet; Take 0.5 tablets (3.75 mg total) by mouth at bedtime.  Delayed sleep phase syndrome -     OLANZapine (ZYPREXA) 7.5 MG tablet; Take 0.5 tablets (3.75 mg total) by mouth at bedtime. -     PARoxetine (PAXIL) 10 MG tablet; Take 1 tablet (10 mg total) by mouth daily.  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  Megan [name] tried multiple medications without sufficient success but has had good results from the combination of low-dose paroxetine plus olanzapine.  Other dosages have failed to adequately control symptoms.  Megan [name] tolerates the medication reasonably well with some bruxism but that appears stable.  Not likely to get benefit below this dose of olanzapine.  Extensive discussion of RLS.  Rec check iron. Does not want to add meds to treat.  Chronic insomnia managed but delayed cycle.  Megan [name]'s still phobic about not sleeping.  But less so.  Now that retired less worried about insomnia.  I don't unwind easilyl  Unlikely that bladder problem is related to paroxetine but cannot rule it out.  Gets yearly physcial for labs.  Discussed potential metabolic side effects associated with atypical antipsychotics, as well as potential risk for movement side effects. Advised pt to contact office if movement side effects occur.   No sig TD but perhaps slight jaw movements noted.  Disc SE concerns.  No med changes.    Follow-up 6 months  Megan Parents, MD, DFAPA  Please see After Visit Summary for patient specific instructions.  No future appointments.   No  orders of the defined types were placed in this encounter.     -------------------------------

## 2021-03-17 ENCOUNTER — Other Ambulatory Visit: Payer: Self-pay | Admitting: Family Medicine

## 2021-03-17 DIAGNOSIS — Z1231 Encounter for screening mammogram for malignant neoplasm of breast: Secondary | ICD-10-CM

## 2021-03-29 ENCOUNTER — Ambulatory Visit: Payer: BC Managed Care – PPO | Attending: Internal Medicine

## 2021-03-29 ENCOUNTER — Other Ambulatory Visit (HOSPITAL_BASED_OUTPATIENT_CLINIC_OR_DEPARTMENT_OTHER): Payer: Self-pay

## 2021-03-29 DIAGNOSIS — Z23 Encounter for immunization: Secondary | ICD-10-CM

## 2021-03-29 MED ORDER — PFIZER COVID-19 VAC BIVALENT 30 MCG/0.3ML IM SUSP
INTRAMUSCULAR | 0 refills | Status: AC
  Filled 2021-03-29: qty 0.3, 1d supply, fill #0

## 2021-03-29 NOTE — Progress Notes (Signed)
   Covid-19 Vaccination Clinic  Name:  Megan [name]    MRN: [id number] DOB: 1956-05-17  03/29/2021  [name] was observed post Covid-19 immunization for 15 minutes without incident. She was provided with Vaccine Information Sheet and instruction to access the V-Safe system.   [name] was instructed to call 911 with any severe reactions post vaccine: Difficulty breathing  Swelling of face and throat  A fast heartbeat  A bad rash all over body  Dizziness and weakness

## 2021-03-30 ENCOUNTER — Ambulatory Visit: Payer: BC Managed Care – PPO

## 2021-04-19 ENCOUNTER — Ambulatory Visit
Admission: RE | Admit: 2021-04-19 | Discharge: 2021-04-19 | Disposition: A | Discharge status: Home / Self Care | Payer: BC Managed Care – PPO | Source: Ambulatory Visit | Attending: Family Medicine | Admitting: Family Medicine

## 2021-04-19 ENCOUNTER — Other Ambulatory Visit: Payer: Self-pay

## 2021-04-19 DIAGNOSIS — Z1231 Encounter for screening mammogram for malignant neoplasm of breast: Secondary | ICD-10-CM

## 2021-06-22 IMAGING — MG DIGITAL SCREENING BILAT W/ TOMO W/ CAD
8 series · 8 of 24 positions shown · non-contrast
Comparison: Previous exam(s).

CLINICAL DATA: Screening.

EXAM:
DIGITAL SCREENING BILATERAL MAMMOGRAM WITH TOMO AND CAD

[L CC synth-2D]
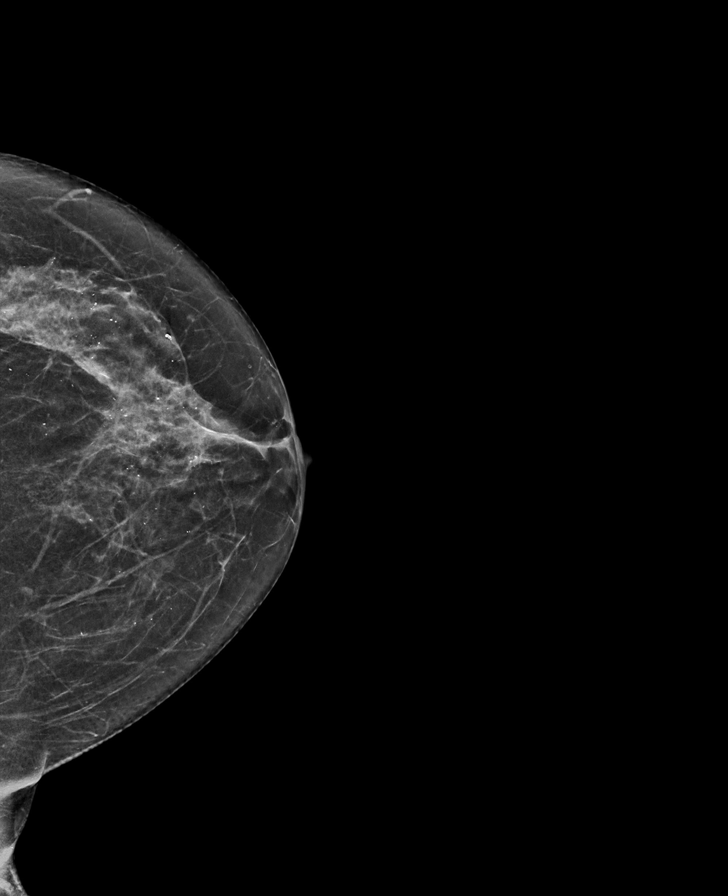

[R MLO synth-2D]
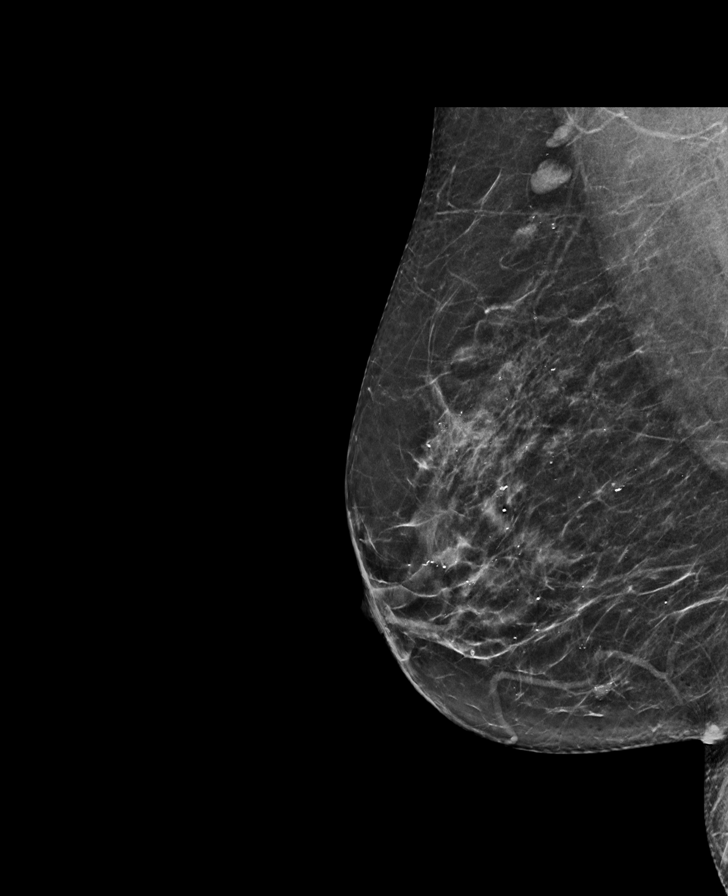

[R CC synth-2D]
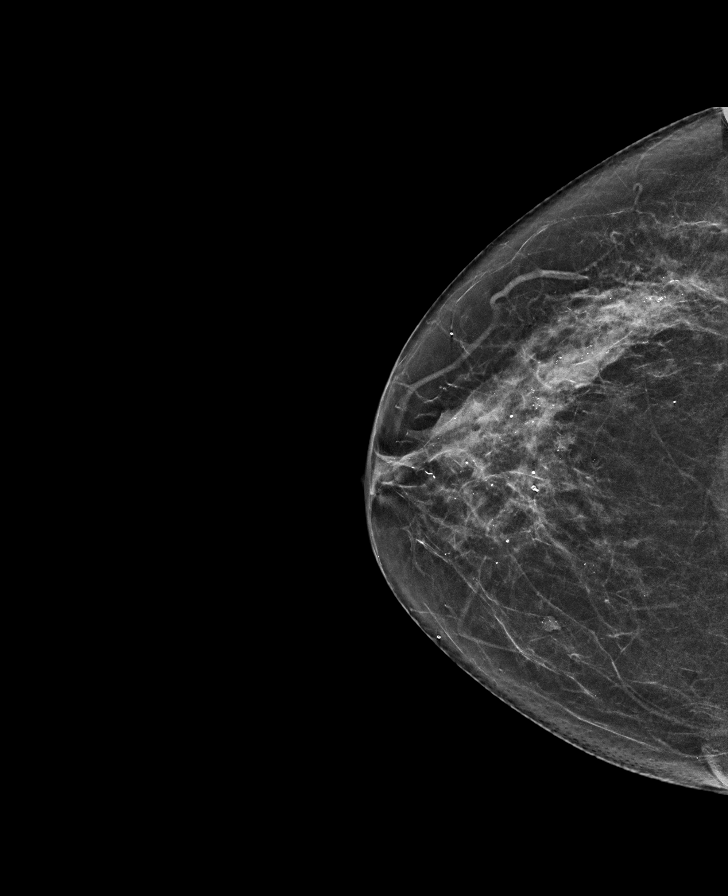

[L MLO synth-2D]
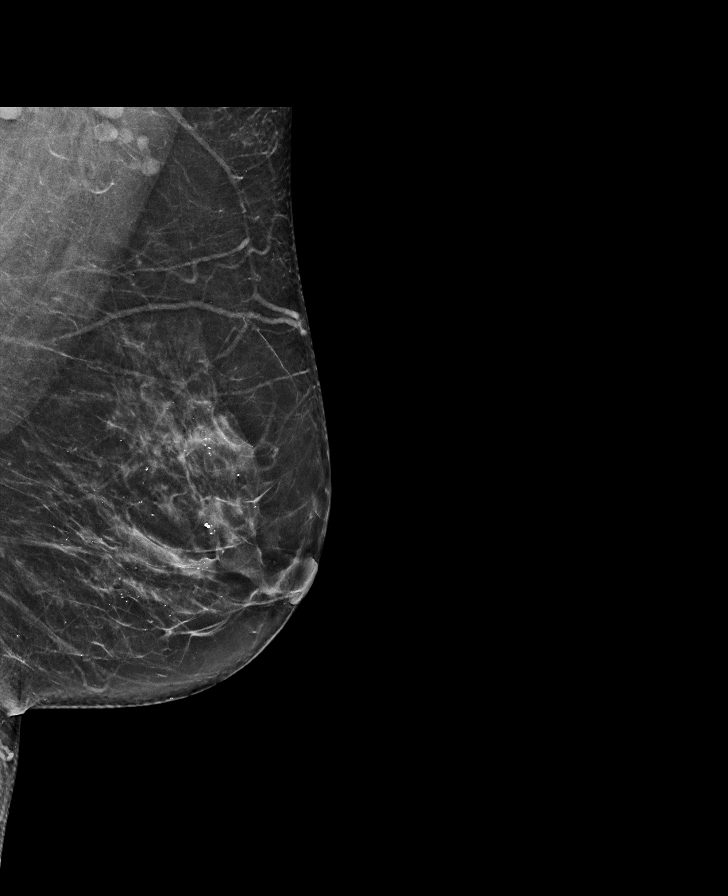

[R CC tomo · tomo slice 33/64.0]
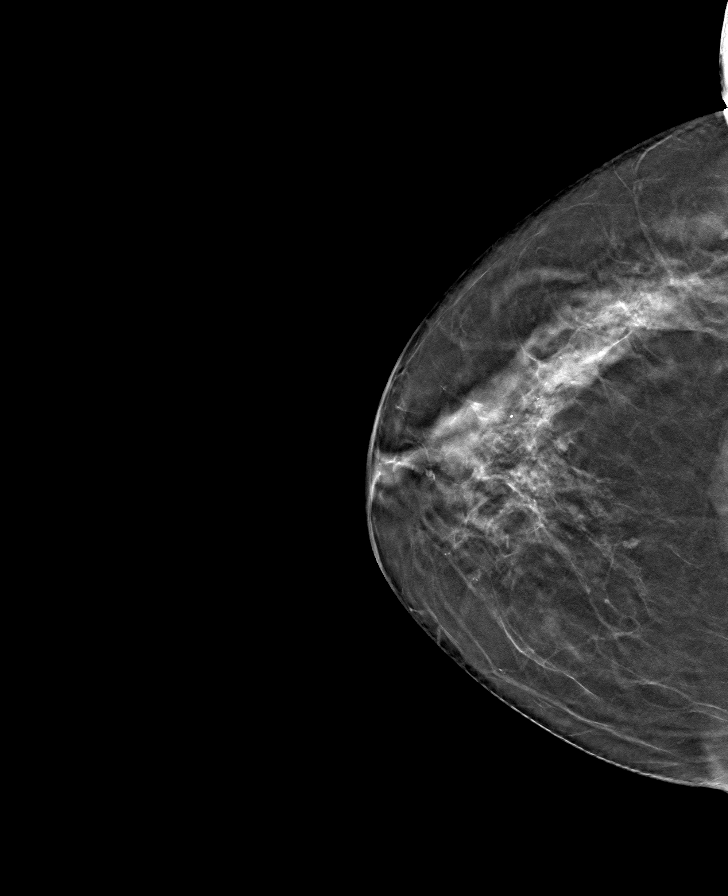

[R MLO tomo · tomo slice 33/65.0]
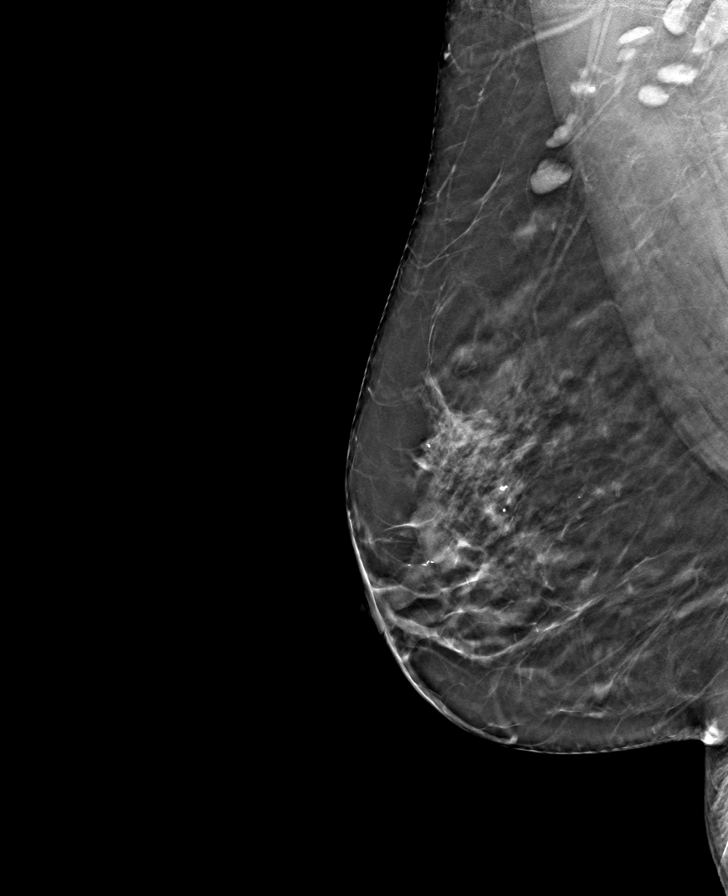

[L CC tomo · tomo slice 31/61.0]
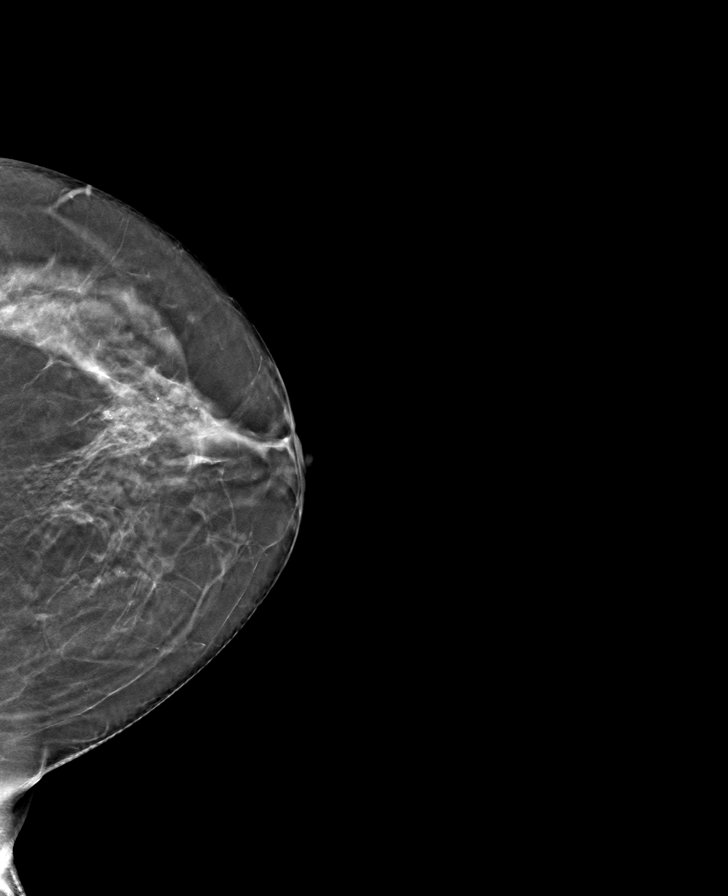

[L MLO tomo · tomo slice 36/71.0]
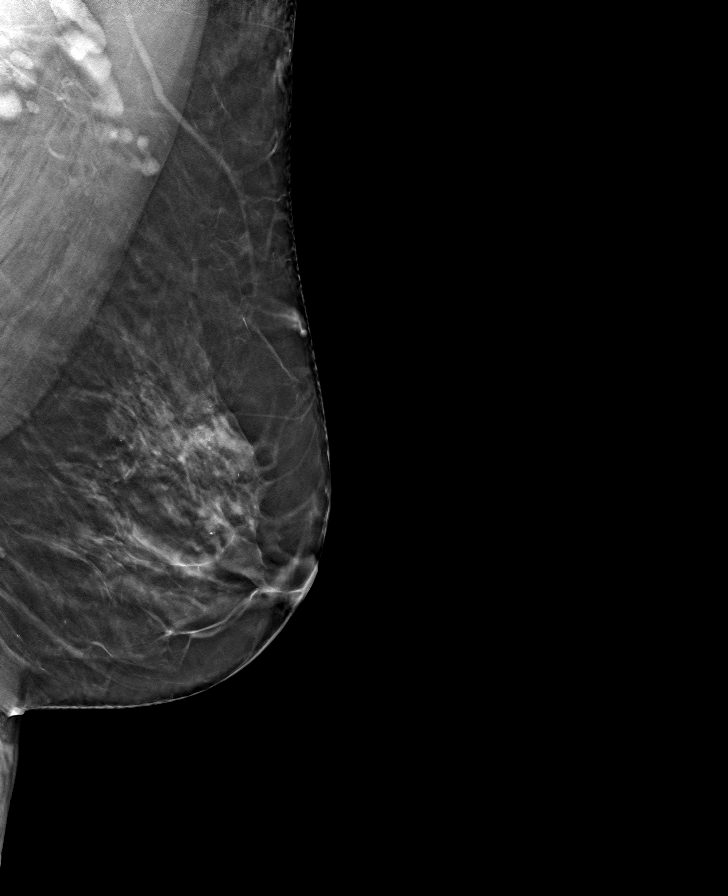

[8 of 24 positions shown; findings below may reference images not displayed]

ACR Breast Density Category c: The breast tissue is heterogeneously
dense, which may obscure small masses.
FINDINGS: There are no findings suspicious for malignancy. Images were
processed with CAD.
IMPRESSION: No mammographic evidence of malignancy. A result letter of this
screening mammogram will be mailed directly to the patient.

RECOMMENDATION:
Screening mammogram in one year. (Code:FT-U-LHB)

BI-RADS CATEGORY  1: Negative.

## 2021-06-23 ENCOUNTER — Telehealth (INDEPENDENT_AMBULATORY_CARE_PROVIDER_SITE_OTHER): Payer: BC Managed Care – PPO | Admitting: Psychiatry

## 2021-06-23 ENCOUNTER — Encounter: Payer: Self-pay | Admitting: Psychiatry

## 2021-06-23 ENCOUNTER — Other Ambulatory Visit: Payer: Self-pay | Admitting: Psychiatry

## 2021-06-23 DIAGNOSIS — F409 Phobic anxiety disorder, unspecified: Secondary | ICD-10-CM

## 2021-06-23 DIAGNOSIS — G2581 Restless legs syndrome: Secondary | ICD-10-CM

## 2021-06-23 DIAGNOSIS — F411 Generalized anxiety disorder: Secondary | ICD-10-CM

## 2021-06-23 DIAGNOSIS — F5105 Insomnia due to other mental disorder: Secondary | ICD-10-CM

## 2021-06-23 DIAGNOSIS — G4721 Circadian rhythm sleep disorder, delayed sleep phase type: Secondary | ICD-10-CM

## 2021-06-23 MED ORDER — OLANZAPINE 7.5 MG PO TABS: 3.7500 mg | ORAL_TABLET | Freq: Every day | ORAL | 1 refills | Status: DC

## 2021-06-23 MED ORDER — PAROXETINE HCL 10 MG PO TABS: 10.0000 mg | ORAL_TABLET | Freq: Every day | ORAL | 1 refills | Status: DC

## 2021-06-23 NOTE — Telephone Encounter (Signed)
Appt today

## 2021-06-23 NOTE — Progress Notes (Signed)
Megan [name] [id number] Jun 20, 1956 65 y.o.  Video Visit via My Chart  I connected with pt by My Chart and verified that I am speaking with the correct person using two identifiers.   I discussed the limitations, risks, security and privacy concerns of performing an evaluation and management service by My Chart  and the availability of in person appointments. I also discussed with the patient that there may be a patient responsible charge related to this service. The patient expressed understanding and agreed to proceed.  I discussed the assessment and treatment plan with the patient. The patient was provided an opportunity to ask questions and all were answered. The patient agreed with the plan and demonstrated an understanding of the instructions.   The patient was advised to call back or seek an in-person evaluation if the symptoms worsen or if the condition fails to improve as anticipated.  I provided 30 minutes of video time during this encounter.  The patient was located at home and the provider was located office. Session from 3 15-3 30.  She has Covid but it is a mild case Subjective:   Patient ID:  Megan [name] is a 65 y.o. (DOB 02-Oct-1955) female.  Chief Complaint:  Chief Complaint  Patient presents with   Follow-up   Anxiety    Anxiety Patient reports no confusion, decreased concentration, nervous/anxious behavior or suicidal ideas.    Depression        Associated symptoms include no decreased concentration and no suicidal ideas.  Past medical history includes anxiety.   Megan [name] presents to the office today for follow-up of anxiety and insomnia chronic.  seen JDecember 2020.  No meds were changed.  Paroxetine 10 mg daily, olanzapine 3.75 mg daily.  12/16/19 appt without med changes and following noted: Fine without significant changes.  No new concerns except bladder issues.  She thinks it's paroxetine.   Has been healthy except BP problems.    No sign anxiety.   Sleep is still a struggle for her. Takes longer to fall asleep than she wants.  Goes to bed 4 AM and sleeps 1030.  Doesn't feel tired.   Takes a couple of hours to fall asleep sometimes. Average to sleep at 2 AM and awaken usually 11:30.  Usually sleeps pretty well.  06/15/2020 appointment with following noted: Goes to [employer]. Will cook Christmas day.  Cleaning and shopping. Doing fine except feeling restless some when sitting still in the evening.  Doesn't affect her when she lies down to go to bed..  Affects her before evening meds. PE in 09-08-22 upcoming.  Never had low iron. Only needs 5  1/2 hours at night.  Goes to bed late and gets up late.   Plan: No med changes  12/21/2020 appointment with the following noted: F died in September 08, 2022 at 65 yo.  Was local. Doing really well with depression and anxiety. Sleep is generally fine but pattern 3-11AM.  Still takes awhile. Takes olanzapine at [place]. SE mild jaw movement and urinary problems with urgency from paroxetine. No RLS when sleeping.  Moves legs a lot on the sofa.  Mildly uncomfortable.  No med change desired. Enjoys TV and pets. D in [place] and doing well.  Not doing anything much.   06/23/2021 appointment with the following noted: Has Covid day minor sx. M had pancreatic cancer.  Asked questions about possible inheritance.  Suggest talk with PCP. Some worry over it.   Anxiety and depression are good.  Sleep is still good with olanzapine.  Patient reports stable mood and denies depressed or irritable moods.  Patient denies any recent difficulty with anxiety.   Denies appetite disturbance.  Patient reports that energy and motivation have been good.  Patient denies any difficulty with concentration.  Patient denies any suicidal ideation.  Prior psychiatric medication failures include mirtazapine, nortriptyline, Wellbutrin, Prozac with side effects, Seroquel, and Xanax and lorazepam and amitriptyline 75 mg.  Paroxetine  Olanzapine 3.75 mg  daily  She had taken amitriptyline from 1995 into 2016.  It was stopped in part due to worsening memory   Review of Systems:  Review of Systems  Gastrointestinal:  Positive for diarrhea.  Genitourinary:  Negative for frequency and urgency.  Neurological:  Negative for tremors and weakness.       Restless  Psychiatric/Behavioral:  Negative for agitation, behavioral problems, confusion, decreased concentration, dysphoric mood, hallucinations, self-injury, sleep disturbance and suicidal ideas. The patient is not nervous/anxious and is not hyperactive.    Medications: I have reviewed the patient's current medications.  Current Outpatient Medications  Medication Sig Dispense Refill   COVID-19 mRNA bivalent vaccine, Pfizer, (PFIZER COVID-19 VAC BIVALENT) injection Inject into the muscle. 0.3 mL 0   COVID-19 mRNA Vac-TriS, Pfizer, SUSP injection Inject into the muscle. 0.3 mL 0   levocetirizine (XYZAL) 5 MG tablet Take 5 mg by mouth every evening.     lisinopril-hydrochlorothiazide (ZESTORETIC) 10-12.5 MG tablet Take 1 tablet by mouth daily.     metoprolol (LOPRESSOR) 50 MG tablet Take 1 tablet (50 mg total) by mouth 2 (two) times daily. For high blood pressure     OLANZapine (ZYPREXA) 7.5 MG tablet Take 0.5 tablets (3.75 mg total) by mouth at bedtime. 45 tablet 1   PARoxetine (PAXIL) 10 MG tablet Take 1 tablet (10 mg total) by mouth daily. 90 tablet 1   No current facility-administered medications for this visit.    Medication Side Effects: OCC urinary problems.  No teeth grinding but moving jaw some  Allergies:  Allergies  Allergen Reactions   Advair Diskus [Fluticasone-Salmeterol]     continuous coughing    Past Medical History:  Diagnosis Date   Arthritis    Asthma    no inhaler use "for months"   GERD (gastroesophageal reflux disease)    Hypertension    Incontinence    Insomnia 1995   Urticaria     Family History  Problem Relation Age of Onset   Hypertension Father     Arthritis Father    Glaucoma Father    Cancer Mother    Depression Mother    Anxiety disorder Mother     Social History   Socioeconomic History   Marital status: Married    Spouse name: Not on file   Number of children: Not on file   Years of education: Not on file   Highest education level: Not on file  Occupational History   Not on file  Tobacco Use   Smoking status: Never   Smokeless tobacco: Never  Substance and Sexual Activity   Alcohol use: No   Drug use: No   Sexual activity: Yes    Birth control/protection: Post-menopausal  Other Topics Concern   Not on file  Social History Narrative   Not on file   Social Determinants of Health   Financial Resource Strain: Not on file  Food Insecurity: Not on file  Transportation Needs: Not on file  Physical Activity: Not on file  Stress: Not on file  Social Connections: Not on file  Intimate Partner Violence: Not on file    Past Medical History, Surgical history, Social history, and Family history were reviewed and updated as appropriate.   Retired from Aeronautical engineer.  Please see review of systems for further details on the patient's review from today.   Objective:   Physical Exam:  There were no vitals taken for this visit.  Physical Exam Neurological:     Mental Status: She is alert and oriented to person, place, and time.     Cranial Nerves: No dysarthria.  Psychiatric:        Attention and Perception: Attention and perception normal.        Mood and Affect: Mood is anxious. Mood is not depressed.        Speech: Speech normal.        Behavior: Behavior is cooperative.        Thought Content: Thought content normal. Thought content is not paranoid or delusional. Thought content does not include homicidal or suicidal ideation. Thought content does not include suicidal plan.        Cognition and Memory: Cognition and memory normal.        Judgment: Judgment normal.     Comments: Insight intact    Lab Review:      Component Value Date/Time   NA 137 05/21/2015 1747   K 4.0 05/21/2015 1747   CL 102 05/21/2015 1747   CO2 30 05/21/2015 1747   GLUCOSE 104 (H) 05/21/2015 1747   BUN 14 05/21/2015 1747   CREATININE 0.75 05/21/2015 1747   CALCIUM 9.4 05/21/2015 1747   PROT 7.8 05/21/2015 1747   ALBUMIN 4.7 05/21/2015 1747   AST 20 05/21/2015 1747   ALT 23 05/21/2015 1747   ALKPHOS 81 05/21/2015 1747   BILITOT 0.5 05/21/2015 1747   GFRNONAA >60 05/21/2015 1747   GFRAA >60 05/21/2015 1747       Component Value Date/Time   WBC 10.4 05/21/2015 1747   RBC 5.07 05/21/2015 1747   HGB 15.0 05/21/2015 1747   HCT 45.5 05/21/2015 1747   PLT 443 (H) 05/21/2015 1747   MCV 89.7 05/21/2015 1747   MCH 29.6 05/21/2015 1747   MCHC 33.0 05/21/2015 1747   RDW 12.4 05/21/2015 1747    No results found for: POCLITH, LITHIUM   No results found for: PHENYTOIN, PHENOBARB, VALPROATE, CBMZ   .res Assessment: Plan:    Clark was seen today for follow-up and anxiety.  Diagnoses and all orders for this visit:  Generalized anxiety disorder -     OLANZapine (ZYPREXA) 7.5 MG tablet; Take 0.5 tablets (3.75 mg total) by mouth at bedtime. -     PARoxetine (PAXIL) 10 MG tablet; Take 1 tablet (10 mg total) by mouth daily.  Phobic disorder -     OLANZapine (ZYPREXA) 7.5 MG tablet; Take 0.5 tablets (3.75 mg total) by mouth at bedtime.  Insomnia due to mental condition -     OLANZapine (ZYPREXA) 7.5 MG tablet; Take 0.5 tablets (3.75 mg total) by mouth at bedtime.  Delayed sleep phase syndrome -     OLANZapine (ZYPREXA) 7.5 MG tablet; Take 0.5 tablets (3.75 mg total) by mouth at bedtime. -     PARoxetine (PAXIL) 10 MG tablet; Take 1 tablet (10 mg total) by mouth daily.  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  She tried multiple medications without  sufficient success but has had good results from the  combination of low-dose paroxetine plus olanzapine.  Other dosages have failed to adequately control symptoms.  She tolerates the medication reasonably well with some ? RLS related but that appears stable.  Not likely to get benefit below this dose of olanzapine.  Chronic insomnia managed but delayed cycle.  She's still phobic about not sleeping.  But less so.  Now that retired less worried about insomnia.  I don't unwind easilyl Mild RLS but not too uncomfortable.  Just moves feet.  Gets yearly physcial for labs. Rec discuss pancreatic CA concerns with PCP at upcoming PE  Discussed potential metabolic side effects associated with atypical antipsychotics, as well as potential risk for movement side effects. Advised pt to contact office if movement side effects occur.   No sig TD  Disc SE concerns.  No med changes. Continue olanzapine 7.5 mg tablets, 1/2 tablet nightly Continue paroxetine 10 mg daily  Follow-up 6 months  Lynder Parents, MD, DFAPA  Please see After Visit Summary for patient specific instructions.  No future appointments.   No orders of the defined types were placed in this encounter.      -------------------------------

## 2021-12-20 ENCOUNTER — Ambulatory Visit: Payer: BC Managed Care – PPO | Admitting: Psychiatry

## 2021-12-20 ENCOUNTER — Encounter: Payer: Self-pay | Admitting: Psychiatry

## 2021-12-20 ENCOUNTER — Other Ambulatory Visit: Payer: Self-pay | Admitting: Psychiatry

## 2021-12-20 DIAGNOSIS — F411 Generalized anxiety disorder: Secondary | ICD-10-CM

## 2021-12-20 DIAGNOSIS — G4721 Circadian rhythm sleep disorder, delayed sleep phase type: Secondary | ICD-10-CM

## 2021-12-20 DIAGNOSIS — G2581 Restless legs syndrome: Secondary | ICD-10-CM

## 2021-12-20 DIAGNOSIS — F409 Phobic anxiety disorder, unspecified: Secondary | ICD-10-CM | POA: Diagnosis not present

## 2021-12-20 DIAGNOSIS — F5105 Insomnia due to other mental disorder: Secondary | ICD-10-CM

## 2021-12-20 DIAGNOSIS — F458 Other somatoform disorders: Secondary | ICD-10-CM

## 2021-12-20 MED ORDER — CYCLOBENZAPRINE HCL 10 MG PO TABS: 5.0000 mg | ORAL_TABLET | Freq: Three times a day (TID) | ORAL | 0 refills | Status: DC | PRN

## 2021-12-20 MED ORDER — OLANZAPINE 7.5 MG PO TABS: 3.7500 mg | ORAL_TABLET | Freq: Every day | ORAL | 1 refills | Status: DC

## 2021-12-20 MED ORDER — PAROXETINE HCL 10 MG PO TABS: 10.0000 mg | ORAL_TABLET | Freq: Every day | ORAL | 1 refills | Status: DC

## 2021-12-20 NOTE — Progress Notes (Signed)
Megan [name] [id number] 02/24/1956 66 y.o.   Subjective:   Patient ID:  Megan [name] is a 66 y.o. (DOB 12-11-1955) female.  Chief Complaint:  Chief Complaint  Patient presents with   Follow-up   Anxiety   Sleeping Problem    Anxiety Patient reports no confusion, decreased concentration, nervous/anxious behavior or suicidal ideas.    Depression        Associated symptoms include no decreased concentration and no suicidal ideas.  Past medical history includes anxiety.    Megan [name] presents to the office today for follow-up of anxiety and insomnia chronic.  seen JDecember 2020.  No meds were changed.  Paroxetine 10 mg daily, olanzapine 3.75 mg daily.  12/16/19 appt without med changes and following noted: Fine without significant changes.  No new concerns except bladder issues.  She thinks it's paroxetine.   Has been healthy except BP problems.    No sign anxiety.  Sleep is still a struggle for her. Takes longer to fall asleep than she wants.  Goes to bed 4 AM and sleeps 1030.  Doesn't feel tired.   Takes a couple of hours to fall asleep sometimes. Average to sleep at 2 AM and awaken usually 11:30.  Usually sleeps pretty well.  06/15/2020 appointment with following noted: Goes to [employer]. Will cook Christmas day.  Cleaning and shopping. Doing fine except feeling restless some when sitting still in the evening.  Doesn't affect her when she lies down to go to bed..  Affects her before evening meds. PE in 07-Aug-2023 upcoming.  Never had low iron. Only needs 5  1/2 hours at night.  Goes to bed late and gets up late.   Plan: No med changes  12/21/2020 appointment with the following noted: F died in 2023/08/07 at 66 yo.  Was local. Doing really well with depression and anxiety. Sleep is generally fine but pattern 3-11AM.  Still takes awhile. Takes olanzapine at MN. SE mild jaw movement and urinary problems with urgency from paroxetine. No RLS when sleeping.  Moves legs a lot on  the sofa.  Mildly uncomfortable.  No med change desired. Enjoys TV and pets. D in [place] and doing well.  Not doing anything much.   06/23/2021 appointment with the following noted: Has Covid day minor sx. M had pancreatic cancer.  Asked questions about possible inheritance.  Suggest talk with PCP. Some worry over it.   Anxiety and depression are good.  Sleep is still good with olanzapine. Patient reports stable mood and denies depressed or irritable moods.  Patient denies any recent difficulty with anxiety.   Denies appetite disturbance.  Patient reports that energy and motivation have been good.  Patient denies any difficulty with concentration.  Patient denies any suicidal ideation. Plan: No med changes. Continue olanzapine 7.5 mg tablets, 1/2 tablet nightly Continue paroxetine 10 mg daily  12/20/2021 appointment with the following noted: One night no sleep last week but usually does sleep.  Big thing was always insomnia and it was a big problem but olanzapine helps. Clenches teeth daytime but not much jaw movement.  Tiring.  No tongue movements. Patient reports stable mood and denies depressed or irritable moods.  Patient denies any recent difficulty with anxiety.  Patient denies difficulty with sleep initiation or maintenance. Denies appetite disturbance.  Patient reports that energy and motivation have been good.  Patient denies any difficulty with concentration.  Patient denies any suicidal ideation. Takes statin med.  PCP checks blood sugar.  Prior psychiatric medication failures include mirtazapine, nortriptyline, Wellbutrin, Prozac with side effects, Seroquel, and Xanax and lorazepam and amitriptyline 75 mg.  Paroxetine  Olanzapine 3.75 mg daily  She had taken amitriptyline from 1995 into 2016.  It was stopped in part due to worsening memory   Review of Systems:  Review of Systems  Gastrointestinal:  Positive for diarrhea.  Genitourinary:  Negative for frequency.  Neurological:   Negative for tremors and weakness.       Restless  Psychiatric/Behavioral:  Negative for agitation, behavioral problems, confusion, decreased concentration, dysphoric mood, hallucinations, self-injury, sleep disturbance and suicidal ideas. The patient is not nervous/anxious and is not hyperactive.     Medications: I have reviewed the patient's current medications.  Current Outpatient Medications  Medication Sig Dispense Refill   atorvastatin (LIPITOR) 10 MG tablet Take 10 mg by mouth daily.     COVID-19 mRNA bivalent vaccine, Pfizer, (PFIZER COVID-19 VAC BIVALENT) injection Inject into the muscle. 0.3 mL 0   COVID-19 mRNA Vac-TriS, Pfizer, SUSP injection Inject into the muscle. 0.3 mL 0   levocetirizine (XYZAL) 5 MG tablet Take 5 mg by mouth every evening.     lisinopril-hydrochlorothiazide (ZESTORETIC) 10-12.5 MG tablet Take 1 tablet by mouth daily.     metoprolol (LOPRESSOR) 50 MG tablet Take 1 tablet (50 mg total) by mouth 2 (two) times daily. For high blood pressure     OLANZapine (ZYPREXA) 7.5 MG tablet Take 0.5 tablets (3.75 mg total) by mouth at bedtime. 45 tablet 1   PARoxetine (PAXIL) 10 MG tablet Take 1 tablet (10 mg total) by mouth daily. 90 tablet 1   No current facility-administered medications for this visit.    Medication Side Effects: OCC urinary problems.  No teeth grinding but moving jaw some  Allergies:  Allergies  Allergen Reactions   Advair Diskus [Fluticasone-Salmeterol]     continuous coughing    Past Medical History:  Diagnosis Date   Arthritis    Asthma    no inhaler use "for months"   GERD (gastroesophageal reflux disease)    Hypertension    Incontinence    Insomnia 1995   Urticaria     Family History  Problem Relation Age of Onset   Hypertension Father    Arthritis Father    Glaucoma Father    Cancer Mother    Depression Mother    Anxiety disorder Mother     Social History   Socioeconomic History   Marital status: Married    Spouse  name: Not on file   Number of children: Not on file   Years of education: Not on file   Highest education level: Not on file  Occupational History   Not on file  Tobacco Use   Smoking status: Never   Smokeless tobacco: Never  Substance and Sexual Activity   Alcohol use: No   Drug use: No   Sexual activity: Yes    Birth control/protection: Post-menopausal  Other Topics Concern   Not on file  Social History Narrative   Not on file   Social Determinants of Health   Financial Resource Strain: Not on file  Food Insecurity: Not on file  Transportation Needs: Not on file  Physical Activity: Not on file  Stress: Not on file  Social Connections: Not on file  Intimate Partner Violence: Not on file    Past Medical History, Surgical history, Social history, and Family history were reviewed and updated as appropriate.   Retired from Associate Professor.  Please  see review of systems for further details on the patient's review from today.   Objective:   Physical Exam:  There were no vitals taken for this visit.  Physical Exam Neurological:     Mental Status: She is alert and oriented to person, place, and time.     Cranial Nerves: No dysarthria.  Psychiatric:        Attention and Perception: Attention and perception normal.        Mood and Affect: Mood is not anxious or depressed.        Speech: Speech normal.        Behavior: Behavior is cooperative.        Thought Content: Thought content normal. Thought content is not paranoid or delusional. Thought content does not include homicidal or suicidal ideation. Thought content does not include suicidal plan.        Cognition and Memory: Cognition and memory normal.        Judgment: Judgment normal.     Comments: Insight intact Anxiety better     Lab Review:     Component Value Date/Time   NA 137 05/21/2015 1747   K 4.0 05/21/2015 1747   CL 102 05/21/2015 1747   CO2 30 05/21/2015 1747   GLUCOSE 104 (H) 05/21/2015 1747   BUN  14 05/21/2015 1747   CREATININE 0.75 05/21/2015 1747   CALCIUM 9.4 05/21/2015 1747   PROT 7.8 05/21/2015 1747   ALBUMIN 4.7 05/21/2015 1747   AST 20 05/21/2015 1747   ALT 23 05/21/2015 1747   ALKPHOS 81 05/21/2015 1747   BILITOT 0.5 05/21/2015 1747   GFRNONAA >60 05/21/2015 1747   GFRAA >60 05/21/2015 1747       Component Value Date/Time   WBC 10.4 05/21/2015 1747   RBC 5.07 05/21/2015 1747   HGB 15.0 05/21/2015 1747   HCT 45.5 05/21/2015 1747   PLT 443 (H) 05/21/2015 1747   MCV 89.7 05/21/2015 1747   MCH 29.6 05/21/2015 1747   MCHC 33.0 05/21/2015 1747   RDW 12.4 05/21/2015 1747    No results found for: "POCLITH", "LITHIUM"   No results found for: "PHENYTOIN", "PHENOBARB", "VALPROATE", "CBMZ"   .res Assessment: Plan:    Rayshonda was seen today for follow-up, anxiety and sleeping problem.  Diagnoses and all orders for this visit:  Generalized anxiety disorder  Phobic disorder  Insomnia due to mental condition  Delayed sleep phase syndrome  Restless legs syndrome   Haillee has a history of 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  She tried multiple medications without sufficient success but has had good results from the combination of low-dose paroxetine plus olanzapine.  Other dosages have failed to adequately control symptoms.  She tolerates the medication reasonably well with some ? RLS related but that appears stable.  Not likely to get benefit below this dose of olanzapine.  Chronic insomnia managed but delayed cycle.  She's still phobic about not sleeping.  But less so.  Now that retired less worried about insomnia.  I don't unwind easilyl Mild RLS but not too uncomfortable.  Just moves feet. Careful about caffeine.  Gets yearly physcial for labs. Rec discuss pancreatic CA concerns with PCP at upcoming PE  Discussed potential metabolic side effects associated with atypical antipsychotics, as well as  potential risk for movement side effects. Advised pt to contact office if movement side effects occur.   No sig TD  Disc SE concerns. Option trial Flexeril  for bruxism.  Disc SE  Trial cyclobenzaprine for bruxism 5-10 BID prn Continue olanzapine 7.5 mg tablets, 1/2 tablet nightly Continue paroxetine 10 mg daily  Follow-up 6 months  Meredith Staggers, MD, DFAPA  Please see After Visit Summary for patient specific instructions.  No future appointments.   No orders of the defined types were placed in this encounter.      -------------------------------

## 2022-03-29 ENCOUNTER — Other Ambulatory Visit: Payer: Self-pay | Admitting: Family Medicine

## 2022-03-29 DIAGNOSIS — Z1231 Encounter for screening mammogram for malignant neoplasm of breast: Secondary | ICD-10-CM

## 2022-04-07 ENCOUNTER — Other Ambulatory Visit (HOSPITAL_BASED_OUTPATIENT_CLINIC_OR_DEPARTMENT_OTHER): Payer: Self-pay

## 2022-04-07 MED ORDER — COVID-19 MRNA 2023-2024 VACCINE (COMIRNATY) 0.3 ML INJECTION
INTRAMUSCULAR | 0 refills | Status: AC
  Filled 2022-04-07: qty 0.3, 1d supply, fill #0

## 2022-04-25 ENCOUNTER — Ambulatory Visit: Payer: BC Managed Care – PPO

## 2022-06-15 ENCOUNTER — Other Ambulatory Visit: Payer: Self-pay | Admitting: Psychiatry

## 2022-06-15 DIAGNOSIS — F409 Phobic anxiety disorder, unspecified: Secondary | ICD-10-CM

## 2022-06-15 DIAGNOSIS — F411 Generalized anxiety disorder: Secondary | ICD-10-CM

## 2022-06-15 DIAGNOSIS — F5105 Insomnia due to other mental disorder: Secondary | ICD-10-CM

## 2022-06-15 DIAGNOSIS — G4721 Circadian rhythm sleep disorder, delayed sleep phase type: Secondary | ICD-10-CM

## 2022-06-21 ENCOUNTER — Ambulatory Visit
Admission: RE | Admit: 2022-06-21 | Discharge: 2022-06-21 | Disposition: A | Discharge status: Home / Self Care | Payer: BC Managed Care – PPO | Source: Ambulatory Visit | Attending: Family Medicine | Admitting: Family Medicine

## 2022-06-21 DIAGNOSIS — Z1231 Encounter for screening mammogram for malignant neoplasm of breast: Secondary | ICD-10-CM

## 2022-09-20 ENCOUNTER — Encounter: Payer: Self-pay | Admitting: Psychiatry

## 2022-09-20 ENCOUNTER — Ambulatory Visit: Payer: BC Managed Care – PPO | Admitting: Psychiatry

## 2022-09-20 DIAGNOSIS — F411 Generalized anxiety disorder: Secondary | ICD-10-CM

## 2022-09-20 DIAGNOSIS — G4721 Circadian rhythm sleep disorder, delayed sleep phase type: Secondary | ICD-10-CM | POA: Diagnosis not present

## 2022-09-20 DIAGNOSIS — F5105 Insomnia due to other mental disorder: Secondary | ICD-10-CM

## 2022-09-20 DIAGNOSIS — G2581 Restless legs syndrome: Secondary | ICD-10-CM

## 2022-09-20 DIAGNOSIS — F409 Phobic anxiety disorder, unspecified: Secondary | ICD-10-CM | POA: Diagnosis not present

## 2022-09-20 DIAGNOSIS — F458 Other somatoform disorders: Secondary | ICD-10-CM

## 2022-09-20 MED ORDER — PAROXETINE HCL 10 MG PO TABS: 10.0000 mg | ORAL_TABLET | Freq: Every day | ORAL | 3 refills | Status: DC

## 2022-09-20 MED ORDER — OLANZAPINE 7.5 MG PO TABS: 3.7500 mg | ORAL_TABLET | Freq: Every day | ORAL | 3 refills | Status: DC

## 2022-09-20 NOTE — Progress Notes (Signed)
Megan [name] [id number] 04-Jul-1955 67 y.o.   Subjective:   Patient ID:  Megan [name] is a 67 y.o. (DOB May 06, 1956) female.  Chief Complaint:  Chief Complaint  Patient presents with   Follow-up    Anxiety Patient reports no confusion, decreased concentration, nervous/anxious behavior or suicidal ideas.    Depression        Associated symptoms include no decreased concentration and no suicidal ideas.  Past medical history includes anxiety.    Megan [name] presents to the office today for follow-up of anxiety and insomnia chronic.  seen JDecember 2020.  No meds were changed.  Paroxetine 10 mg daily, olanzapine 3.75 mg daily.  12/16/19 appt without med changes and following noted: Fine without significant changes.  No new concerns except bladder issues.  She thinks it's paroxetine.   Has been healthy except BP problems.    No sign anxiety.  Sleep is still a struggle for her. Takes longer to fall asleep than she wants.  Goes to bed 4 AM and sleeps 1030.  Doesn't feel tired.   Takes a couple of hours to fall asleep sometimes. Average to sleep at 2 AM and awaken usually 11:30.  Usually sleeps pretty well.  06/15/2020 appointment with following noted: Goes to [employer]. Will cook Christmas day.  Cleaning and shopping. Doing fine except feeling restless some when sitting still in the evening.  Doesn't affect her when she lies down to go to bed..  Affects her before evening meds. PE in 09/13/22 upcoming.  Never had low iron. Only needs 5  1/2 hours at night.  Goes to bed late and gets up late.   Plan: No med changes  12/21/2020 appointment with the following noted: F died in 09/13/2022 at 67 yo.  Was local. Doing really well with depression and anxiety. Sleep is generally fine but pattern 3-11AM.  Still takes awhile. Takes olanzapine at MN. SE mild jaw movement and urinary problems with urgency from paroxetine. No RLS when sleeping.  Moves legs a lot on the sofa.  Mildly uncomfortable.   No med change desired. Enjoys TV and pets. D in [place] and doing well.  Not doing anything much.   06/23/2021 appointment with the following noted: Has Covid day minor sx. M had pancreatic cancer.  Asked questions about possible inheritance.  Suggest talk with PCP. Some worry over it.   Anxiety and depression are good.  Sleep is still good with olanzapine. Patient reports stable mood and denies depressed or irritable moods.  Patient denies any recent difficulty with anxiety.   Denies appetite disturbance.  Patient reports that energy and motivation have been good.  Patient denies any difficulty with concentration.  Patient denies any suicidal ideation. Plan: No med changes. Continue olanzapine 7.5 mg tablets, 1/2 tablet nightly Continue paroxetine 10 mg daily  12/20/2021 appointment with the following noted: One night no sleep last week but usually does sleep.  Big thing was always insomnia and it was a big problem but olanzapine helps. Clenches teeth daytime but not much jaw movement.  Tiring.  No tongue movements. Patient reports stable mood and denies depressed or irritable moods.  Patient denies any recent difficulty with anxiety.  Patient denies difficulty with sleep initiation or maintenance. Denies appetite disturbance.  Patient reports that energy and motivation have been good.  Patient denies any difficulty with concentration.  Patient denies any suicidal ideation. Takes statin med.  PCP checks blood sugar.  09/20/22 appt noted: Consistent with meds. Chronic  jaw tension.  Is annoying.  Same all day. My only complaint. Sleep is good with meds.  Enough sleep. depression and anxiety are under control.  Never had much depression but had insomnia.  Only cried once in life.  A lot more calm generally with meds.   Prior psychiatric medication failures include mirtazapine, nortriptyline, Wellbutrin, Prozac with side effects, Seroquel, and Xanax and lorazepam and amitriptyline 75 mg.   Paroxetine  Olanzapine 3.75 mg daily  She had taken amitriptyline from 1995 into 2016.  It was stopped in part due to worsening memory   Review of Systems:  Review of Systems  Gastrointestinal:  Negative for diarrhea.  Genitourinary:  Negative for frequency.  Neurological:  Negative for tremors and weakness.       Restless  Psychiatric/Behavioral:  Negative for agitation, behavioral problems, confusion, decreased concentration, dysphoric mood, hallucinations, self-injury, sleep disturbance and suicidal ideas. The patient is not nervous/anxious and is not hyperactive.     Medications: I have reviewed the patient's current medications.  Current Outpatient Medications  Medication Sig Dispense Refill   atorvastatin (LIPITOR) 10 MG tablet Take 10 mg by mouth daily.     levocetirizine (XYZAL) 5 MG tablet Take 5 mg by mouth every evening.     lisinopril-hydrochlorothiazide (ZESTORETIC) 10-12.5 MG tablet Take 1 tablet by mouth daily.     metoprolol (LOPRESSOR) 50 MG tablet Take 1 tablet (50 mg total) by mouth 2 (two) times daily. For high blood pressure     COVID-19 mRNA bivalent vaccine, Pfizer, (PFIZER COVID-19 VAC BIVALENT) injection Inject into the muscle. (Patient not taking: Reported on 09/20/2022) 0.3 mL 0   COVID-19 mRNA Vac-TriS, Pfizer, SUSP injection Inject into the muscle. (Patient not taking: Reported on 09/20/2022) 0.3 mL 0   COVID-19 mRNA vaccine 2023-2024 (COMIRNATY) SUSP injection Inject into the muscle. (Patient not taking: Reported on 09/20/2022) 0.3 mL 0   OLANZapine (ZYPREXA) 7.5 MG tablet Take 0.5 tablets (3.75 mg total) by mouth at bedtime. 45 tablet 3   PARoxetine (PAXIL) 10 MG tablet Take 1 tablet (10 mg total) by mouth daily. 90 tablet 3   No current facility-administered medications for this visit.    Medication Side Effects: OCC urinary problems.  No teeth grinding but moving jaw some  Allergies:  Allergies  Allergen Reactions   Advair Diskus  [Fluticasone-Salmeterol]     continuous coughing    Past Medical History:  Diagnosis Date   Arthritis    Asthma    no inhaler use "for months"   GERD (gastroesophageal reflux disease)    Hypertension    Incontinence    Insomnia 1995   Urticaria     Family History  Problem Relation Age of Onset   Hypertension Father    Arthritis Father    Glaucoma Father    Cancer Mother    Depression Mother    Anxiety disorder Mother     Social History   Socioeconomic History   Marital status: Married    Spouse name: Not on file   Number of children: Not on file   Years of education: Not on file   Highest education level: Not on file  Occupational History   Not on file  Tobacco Use   Smoking status: Never   Smokeless tobacco: Never  Substance and Sexual Activity   Alcohol use: No   Drug use: No   Sexual activity: Yes    Birth control/protection: Post-menopausal  Other Topics Concern   Not on file  Social  History Narrative   Not on file   Social Determinants of Health   Financial Resource Strain: Not on file  Food Insecurity: Not on file  Transportation Needs: Not on file  Physical Activity: Not on file  Stress: Not on file  Social Connections: Not on file  Intimate Partner Violence: Not on file    Past Medical History, Surgical history, Social history, and Family history were reviewed and updated as appropriate.   Retired from Aeronautical engineer.  Please see review of systems for further details on the patient's review from today.   Objective:   Physical Exam:  There were no vitals taken for this visit.  Physical Exam Constitutional:      General: She is not in acute distress. Musculoskeletal:        General: No deformity.  Neurological:     Mental Status: She is alert and oriented to person, place, and time.     Cranial Nerves: No dysarthria.     Coordination: Coordination normal.  Psychiatric:        Attention and Perception: Attention and perception  normal. She does not perceive auditory or visual hallucinations.        Mood and Affect: Mood normal. Mood is not anxious or depressed. Affect is not labile, blunt, angry or inappropriate.        Speech: Speech normal.        Behavior: Behavior normal. Behavior is cooperative.        Thought Content: Thought content normal. Thought content is not paranoid or delusional. Thought content does not include homicidal or suicidal ideation. Thought content does not include suicidal plan.        Cognition and Memory: Cognition and memory normal.        Judgment: Judgment normal.     Comments: Insight intact Anxiety resolved.     Lab Review:     Component Value Date/Time   NA 137 05/21/2015 1747   K 4.0 05/21/2015 1747   CL 102 05/21/2015 1747   CO2 30 05/21/2015 1747   GLUCOSE 104 (H) 05/21/2015 1747   BUN 14 05/21/2015 1747   CREATININE 0.75 05/21/2015 1747   CALCIUM 9.4 05/21/2015 1747   PROT 7.8 05/21/2015 1747   ALBUMIN 4.7 05/21/2015 1747   AST 20 05/21/2015 1747   ALT 23 05/21/2015 1747   ALKPHOS 81 05/21/2015 1747   BILITOT 0.5 05/21/2015 1747   GFRNONAA >60 05/21/2015 1747   GFRAA >60 05/21/2015 1747       Component Value Date/Time   WBC 10.4 05/21/2015 1747   RBC 5.07 05/21/2015 1747   HGB 15.0 05/21/2015 1747   HCT 45.5 05/21/2015 1747   PLT 443 (H) 05/21/2015 1747   MCV 89.7 05/21/2015 1747   MCH 29.6 05/21/2015 1747   MCHC 33.0 05/21/2015 1747   RDW 12.4 05/21/2015 1747    No results found for: "POCLITH", "LITHIUM"   No results found for: "PHENYTOIN", "PHENOBARB", "VALPROATE", "CBMZ"   .res Assessment: Plan:    Markeitha was seen today for follow-up.  Diagnoses and all orders for this visit:  Generalized anxiety disorder -     OLANZapine (ZYPREXA) 7.5 MG tablet; Take 0.5 tablets (3.75 mg total) by mouth at bedtime. -     PARoxetine (PAXIL) 10 MG tablet; Take 1 tablet (10 mg total) by mouth daily.  Phobic disorder -     OLANZapine (ZYPREXA) 7.5 MG tablet;  Take 0.5 tablets (3.75 mg total) by mouth at bedtime.  Insomnia due  to mental condition -     OLANZapine (ZYPREXA) 7.5 MG tablet; Take 0.5 tablets (3.75 mg total) by mouth at bedtime.  Delayed sleep phase syndrome -     OLANZapine (ZYPREXA) 7.5 MG tablet; Take 0.5 tablets (3.75 mg total) by mouth at bedtime. -     PARoxetine (PAXIL) 10 MG tablet; Take 1 tablet (10 mg total) by mouth daily.  Bruxism  Restless legs syndrome   Alijah has a history of 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  She tried multiple medications without sufficient success but has had good results from the combination of low-dose paroxetine plus olanzapine.  Other dosages have failed to adequately control symptoms.  She tolerates the medication reasonably well with some ? RLS related but that appears stable.  Not likely to get benefit below this dose of olanzapine but could try it.  Chronic insomnia managed but delayed cycle.  She's still phobic about not sleeping.  But less so.  Now that retired less worried about insomnia.  I don't unwind easilyl Mild RLS but not too uncomfortable.  Just moves feet. Careful about caffeine.  Discussed potential metabolic side effects associated with atypical antipsychotics, as well as potential risk for movement side effects. Advised pt to contact office if movement side effects occur.   No sig TD  She's reluctant to decrease olanzapine bc fear of insomnia. Disc SE concerns. Option trial other meds for bruxism.  Disc SE  Trial cyclobenzaprine for bruxism 5-10 BID prn.  She doesn't think it helped. Continue olanzapine 7.5 mg tablets, 1/2 tablet nightly Continue paroxetine 10 mg daily  Follow-up 9-12 months  Lynder Parents, MD, DFAPA  Please see After Visit Summary for patient specific instructions.  No future appointments.   No orders of the defined types were placed in this encounter.       -------------------------------

## 2023-02-06 ENCOUNTER — Telehealth: Payer: Self-pay | Admitting: Psychiatry

## 2023-02-06 NOTE — Telephone Encounter (Signed)
Please see message. °

## 2023-02-06 NOTE — Telephone Encounter (Signed)
Patient lvm at 11:44 stating that she went to medicare and needs a PA for Olazapine 7.5mg . Ph: [phone]

## 2023-02-08 NOTE — Telephone Encounter (Signed)
Noted thanks °

## 2023-02-08 NOTE — Telephone Encounter (Signed)
Tried submitting a PA under [employer] but response back is it's too soon to fill.   Submitted another PA under [facility], the other Rx benefit noted on file. Pending response back from that.   Pt only takes 1/2 tablet daily (3.75 mg) Olanzapine 7.5 mg so quantity should not be an issue other than request is #45 for 90 day supply. Pt is not 67 yet so not her age. [employer] only allows #30 per 30 day submitted for PA.

## 2023-02-08 NOTE — Telephone Encounter (Signed)
Addendum to attached message on 8/13. Megan with [employer] [facility] [employer] called informing that Kacy Provo Olanzapine 7.5 mg tabs has been approved for coverage from 02/08/23 thru 02/08/24.

## 2023-02-09 ENCOUNTER — Telehealth: Payer: Self-pay | Admitting: Psychiatry

## 2023-02-09 NOTE — Telephone Encounter (Signed)
[employer] [place] approved OLANZAPINE 7.5mg   thru 02/08/2024

## 2023-02-09 NOTE — Telephone Encounter (Signed)
Noted thank you

## 2023-03-29 DIAGNOSIS — R7303 Prediabetes: Secondary | ICD-10-CM | POA: Diagnosis not present

## 2023-04-05 ENCOUNTER — Other Ambulatory Visit (HOSPITAL_BASED_OUTPATIENT_CLINIC_OR_DEPARTMENT_OTHER): Payer: Self-pay

## 2023-04-05 MED ORDER — COVID-19 MRNA VAC-TRIS(PFIZER) 30 MCG/0.3ML IM SUSY
0.3000 mL | PREFILLED_SYRINGE | Freq: Once | INTRAMUSCULAR | 0 refills | Status: AC
  Filled 2023-04-05: qty 0.3, 1d supply, fill #0

## 2023-04-12 DIAGNOSIS — K080 Exfoliation of teeth due to systemic causes: Secondary | ICD-10-CM | POA: Diagnosis not present

## 2023-05-30 ENCOUNTER — Other Ambulatory Visit: Payer: Self-pay | Admitting: Family Medicine

## 2023-05-30 DIAGNOSIS — Z1231 Encounter for screening mammogram for malignant neoplasm of breast: Secondary | ICD-10-CM

## 2023-06-28 ENCOUNTER — Ambulatory Visit
Admission: RE | Admit: 2023-06-28 | Discharge: 2023-06-28 | Disposition: A | Discharge status: Home / Self Care | Payer: Medicare Other | Source: Ambulatory Visit | Attending: Family Medicine | Admitting: Family Medicine

## 2023-06-28 DIAGNOSIS — Z1231 Encounter for screening mammogram for malignant neoplasm of breast: Secondary | ICD-10-CM | POA: Diagnosis not present

## 2023-07-26 DIAGNOSIS — M1711 Unilateral primary osteoarthritis, right knee: Secondary | ICD-10-CM | POA: Diagnosis not present

## 2023-08-24 DIAGNOSIS — I10 Essential (primary) hypertension: Secondary | ICD-10-CM | POA: Diagnosis not present

## 2023-08-24 DIAGNOSIS — Z0000 Encounter for general adult medical examination without abnormal findings: Secondary | ICD-10-CM | POA: Diagnosis not present

## 2023-08-24 DIAGNOSIS — Z23 Encounter for immunization: Secondary | ICD-10-CM | POA: Diagnosis not present

## 2023-08-24 DIAGNOSIS — F331 Major depressive disorder, recurrent, moderate: Secondary | ICD-10-CM | POA: Diagnosis not present

## 2023-08-24 DIAGNOSIS — E7800 Pure hypercholesterolemia, unspecified: Secondary | ICD-10-CM | POA: Diagnosis not present

## 2023-08-24 DIAGNOSIS — D473 Essential (hemorrhagic) thrombocythemia: Secondary | ICD-10-CM | POA: Diagnosis not present

## 2023-08-24 DIAGNOSIS — Z79899 Other long term (current) drug therapy: Secondary | ICD-10-CM | POA: Diagnosis not present

## 2023-08-24 DIAGNOSIS — R7303 Prediabetes: Secondary | ICD-10-CM | POA: Diagnosis not present

## 2023-08-27 ENCOUNTER — Other Ambulatory Visit: Payer: Self-pay | Admitting: Family Medicine

## 2023-08-27 DIAGNOSIS — E2839 Other primary ovarian failure: Secondary | ICD-10-CM

## 2023-09-07 DIAGNOSIS — Z1211 Encounter for screening for malignant neoplasm of colon: Secondary | ICD-10-CM | POA: Diagnosis not present

## 2023-09-20 ENCOUNTER — Ambulatory Visit (INDEPENDENT_AMBULATORY_CARE_PROVIDER_SITE_OTHER): Payer: BC Managed Care – PPO | Admitting: Psychiatry

## 2023-09-20 ENCOUNTER — Encounter: Payer: Self-pay | Admitting: Psychiatry

## 2023-09-20 DIAGNOSIS — G4721 Circadian rhythm sleep disorder, delayed sleep phase type: Secondary | ICD-10-CM

## 2023-09-20 DIAGNOSIS — F5105 Insomnia due to other mental disorder: Secondary | ICD-10-CM

## 2023-09-20 DIAGNOSIS — F409 Phobic anxiety disorder, unspecified: Secondary | ICD-10-CM | POA: Diagnosis not present

## 2023-09-20 DIAGNOSIS — F411 Generalized anxiety disorder: Secondary | ICD-10-CM

## 2023-09-20 MED ORDER — OLANZAPINE 7.5 MG PO TABS: 3.7500 mg | ORAL_TABLET | Freq: Every day | ORAL | 3 refills | Status: DC

## 2023-09-20 MED ORDER — PAROXETINE HCL 10 MG PO TABS: 10.0000 mg | ORAL_TABLET | Freq: Every day | ORAL | 3 refills | Status: DC

## 2023-09-20 NOTE — Progress Notes (Signed)
 [name] [id number] 12/03/1955 68 y.o.   Subjective:   Patient ID:  Megan [name] is a 68 y.o. (DOB 10-23-1955) female.  Chief Complaint:  Chief Complaint  Patient presents with   Follow-up   Depression   Anxiety   Sleeping Problem    Megan [name] presents to the office today for follow-up of anxiety and insomnia chronic.  seen JDecember 2020.  No meds were changed.  Paroxetine 10 mg daily, olanzapine 3.75 mg daily.  12/16/19 appt without med changes and following noted: Fine without significant changes.  No new concerns except bladder issues.  She thinks it's paroxetine.   Has been healthy except BP problems.    No sign anxiety.  Sleep is still a struggle for her. Takes longer to fall asleep than she wants.  Goes to bed 4 AM and sleeps 1030.  Doesn't feel tired.   Takes a couple of hours to fall asleep sometimes. Average to sleep at 2 AM and awaken usually 11:30.  Usually sleeps pretty well.  06/15/2020 appointment with following noted: Goes to [employer]. Will cook Christmas day.  Cleaning and shopping. Doing fine except feeling restless some when sitting still in the evening.  Doesn't affect her when she lies down to go to bed..  Affects her before evening meds. PE in Sep 11, 2023 upcoming.  Never had low iron. Only needs 5  1/2 hours at night.  Goes to bed late and gets up late.   Plan: No med changes  12/21/2020 appointment with the following noted: F died in 09/11/2023 at 66 yo.  Was local. Doing really well with depression and anxiety. Sleep is generally fine but pattern 3-11AM.  Still takes awhile. Takes olanzapine at [place]. SE mild jaw movement and urinary problems with urgency from paroxetine. No RLS when sleeping.  Moves legs a lot on the sofa.  Mildly uncomfortable.  No med change desired. Enjoys TV and pets. D in [place] and doing well.  Not doing anything much.   06/23/2021 appointment with the following noted: Has Covid day minor sx. M had pancreatic cancer.  Asked  questions about possible inheritance.  Suggest talk with PCP. Some worry over it.   Anxiety and depression are good.  Sleep is still good with olanzapine. Patient reports stable mood and denies depressed or irritable moods.  Patient denies any recent difficulty with anxiety.   Denies appetite disturbance.  Patient reports that energy and motivation have been good.  Patient denies any difficulty with concentration.  Patient denies any suicidal ideation. Plan: No med changes. Continue olanzapine 7.5 mg tablets, 1/2 tablet nightly Continue paroxetine 10 mg daily  12/20/2021 appointment with the following noted: One night no sleep last week but usually does sleep.  Big thing was always insomnia and it was a big problem but olanzapine helps. Clenches teeth daytime but not much jaw movement.  Tiring.  No tongue movements. Patient reports stable mood and denies depressed or irritable moods.  Patient denies any recent difficulty with anxiety.  Patient denies difficulty with sleep initiation or maintenance. Denies appetite disturbance.  Patient reports that energy and motivation have been good.  Patient denies any difficulty with concentration.  Patient denies any suicidal ideation. Takes statin med.  PCP checks blood sugar.  09/20/22 appt noted: Consistent with meds. Chronic jaw tension.  Is annoying.  Same all day. My only complaint. Sleep is good with meds.  Enough sleep. depression and anxiety are under control.  Never had much depression but  had insomnia.  Only cried once in life.  A lot more calm generally with meds.  09/18/23 appt noted:  Med olanzapine 7.5 mg 1/2 HS, paroxetine 10 No anxiety as long as she can sleep or if takes a trip. RLS is mild. No dep.  Sleep is good.  But takes a couple of hours to fall asleep.  Fearful of having insomnia again.  Getting about 7 hours. PCP good. SE clenching and grinding some.  Consistent with meds.  Prior psychiatric medication failures include   mirtazapine, nortriptyline, Wellbutrin, Prozac with side effects,  Paroxetine  Seroquel, and Xanax and lorazepam and  amitriptyline 75 mg.   Olanzapine 3.75 mg daily She had taken amitriptyline from 1995 into 2016.  It was stopped in part due to worsening memory   Review of Systems:  Review of Systems  Gastrointestinal:  Negative for diarrhea.  Genitourinary:  Negative for frequency.  Neurological:  Negative for tremors and weakness.       Restless  Psychiatric/Behavioral:  Negative for agitation, behavioral problems, confusion, decreased concentration, dysphoric mood, hallucinations, self-injury, sleep disturbance and suicidal ideas. The patient is not nervous/anxious and is not hyperactive.     Medications: I have reviewed the patient's current medications.  Current Outpatient Medications  Medication Sig Dispense Refill   atorvastatin (LIPITOR) 10 MG tablet Take 10 mg by mouth daily.     levocetirizine (XYZAL) 5 MG tablet Take 5 mg by mouth every evening.     lisinopril-hydrochlorothiazide (ZESTORETIC) 10-12.5 MG tablet Take 1 tablet by mouth daily.     metoprolol (LOPRESSOR) 50 MG tablet Take 1 tablet (50 mg total) by mouth 2 (two) times daily. For high blood pressure     COVID-19 mRNA bivalent vaccine, Pfizer, (PFIZER COVID-19 VAC BIVALENT) injection Inject into the muscle. (Patient not taking: Reported on 09/20/2023) 0.3 mL 0   COVID-19 mRNA Vac-TriS, Pfizer, SUSP injection Inject into the muscle. (Patient not taking: Reported on 09/20/2023) 0.3 mL 0   COVID-19 mRNA vaccine 2023-2024 (COMIRNATY) SUSP injection Inject into the muscle. (Patient not taking: Reported on 09/20/2023) 0.3 mL 0   OLANZapine (ZYPREXA) 7.5 MG tablet Take 0.5 tablets (3.75 mg total) by mouth at bedtime. 45 tablet 3   PARoxetine (PAXIL) 10 MG tablet Take 1 tablet (10 mg total) by mouth daily. 90 tablet 3   No current facility-administered medications for this visit.    Medication Side Effects: OCC urinary  problems.  No teeth grinding but moving jaw some  Allergies:  Allergies  Allergen Reactions   Advair Diskus [Fluticasone-Salmeterol]     continuous coughing    Past Medical History:  Diagnosis Date   Arthritis    Asthma    no inhaler use "for months"   GERD (gastroesophageal reflux disease)    Hypertension    Incontinence    Insomnia 1995   Urticaria     Family History  Problem Relation Age of Onset   Cancer Mother    Depression Mother    Anxiety disorder Mother    Hypertension Father    Arthritis Father    Glaucoma Father    Breast cancer Neg Hx     Social History   Socioeconomic History   Marital status: Married    Spouse name: Not on file   Number of children: Not on file   Years of education: Not on file   Highest education level: Not on file  Occupational History   Not on file  Tobacco Use   Smoking  status: Never   Smokeless tobacco: Never  Substance and Sexual Activity   Alcohol use: No   Drug use: No   Sexual activity: Yes    Birth control/protection: Post-menopausal  Other Topics Concern   Not on file  Social History Narrative   Not on file   Social Drivers of Health   Financial Resource Strain: Not on file  Food Insecurity: Not on file  Transportation Needs: Not on file  Physical Activity: Not on file  Stress: Not on file  Social Connections: Not on file  Intimate Partner Violence: Not on file    Past Medical History, Surgical history, Social history, and Family history were reviewed and updated as appropriate.   Retired from Associate Professor.  Please see review of systems for further details on the patient's review from today.   Objective:   Physical Exam:  There were no vitals taken for this visit.  Physical Exam Constitutional:      General: She is not in acute distress. Musculoskeletal:        General: No deformity.  Neurological:     Mental Status: She is alert and oriented to person, place, and time.     Cranial Nerves: No  dysarthria.     Coordination: Coordination normal.     Comments: neuro  Psychiatric:        Attention and Perception: Attention and perception normal. She does not perceive auditory or visual hallucinations.        Mood and Affect: Mood normal. Mood is not anxious or depressed. Affect is not labile, blunt, angry or inappropriate.        Speech: Speech normal.        Behavior: Behavior normal. Behavior is cooperative.        Thought Content: Thought content normal. Thought content is not paranoid or delusional. Thought content does not include homicidal or suicidal ideation. Thought content does not include suicidal plan.        Cognition and Memory: Cognition and memory normal.        Judgment: Judgment normal.     Comments: Insight intact Anxiety resolved.     Lab Review:     Component Value Date/Time   NA 137 05/21/2015 1747   K 4.0 05/21/2015 1747   CL 102 05/21/2015 1747   CO2 30 05/21/2015 1747   GLUCOSE 104 (H) 05/21/2015 1747   BUN 14 05/21/2015 1747   CREATININE 0.75 05/21/2015 1747   CALCIUM 9.4 05/21/2015 1747   PROT 7.8 05/21/2015 1747   ALBUMIN 4.7 05/21/2015 1747   AST 20 05/21/2015 1747   ALT 23 05/21/2015 1747   ALKPHOS 81 05/21/2015 1747   BILITOT 0.5 05/21/2015 1747   GFRNONAA >60 05/21/2015 1747   GFRAA >60 05/21/2015 1747       Component Value Date/Time   WBC 10.4 05/21/2015 1747   RBC 5.07 05/21/2015 1747   HGB 15.0 05/21/2015 1747   HCT 45.5 05/21/2015 1747   PLT 443 (H) 05/21/2015 1747   MCV 89.7 05/21/2015 1747   MCH 29.6 05/21/2015 1747   MCHC 33.0 05/21/2015 1747   RDW 12.4 05/21/2015 1747    No results found for: "POCLITH", "LITHIUM"   No results found for: "PHENYTOIN", "PHENOBARB", "VALPROATE", "CBMZ"   .res Assessment: Plan:    Misao was seen today for follow-up, depression, anxiety and sleeping problem.  Diagnoses and all orders for this visit:  Generalized anxiety disorder -     OLANZapine (ZYPREXA) 7.5 MG tablet; Take  0.5  tablets (3.75 mg total) by mouth at bedtime. -     PARoxetine (PAXIL) 10 MG tablet; Take 1 tablet (10 mg total) by mouth daily.  Phobic disorder -     OLANZapine (ZYPREXA) 7.5 MG tablet; Take 0.5 tablets (3.75 mg total) by mouth at bedtime.  Insomnia due to mental condition -     OLANZapine (ZYPREXA) 7.5 MG tablet; Take 0.5 tablets (3.75 mg total) by mouth at bedtime.  Delayed sleep phase syndrome -     OLANZapine (ZYPREXA) 7.5 MG tablet; Take 0.5 tablets (3.75 mg total) by mouth at bedtime. -     PARoxetine (PAXIL) 10 MG tablet; Take 1 tablet (10 mg total) by mouth daily.    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  She tried multiple medications without sufficient success but has had good results from the combination of low-dose paroxetine plus olanzapine.  Other dosages have failed to adequately control symptoms.  She tolerates the medication reasonably well with some ? RLS related but that appears stable.  Not likely to get benefit below this dose of olanzapine but could try it.  Chronic insomnia managed but delayed cycle.  She's still phobic about not sleeping.  But less so.  Now that retired less worried about insomnia.  I don't unwind easilyl Biggest benefit of olanzapine is sleep which in turn helps anxiety. Mild RLS but not too uncomfortable.  Just moves feet. Careful about caffeine.  Discussed potential metabolic side effects associated with atypical antipsychotics, as well as potential risk for movement side effects. Advised pt to contact office if movement side effects occur.   No sig TD  She's reluctant to decrease olanzapine bc fear of insomnia. Disc SE concerns. Option trial other meds for bruxism.  Disc SE  she does not want to take more meds.  Continue olanzapine 7.5 mg tablets, 1/2 tablet nightly Continue paroxetine 10 mg daily  Follow-up 12 months  Meredith Staggers, MD, DFAPA  Please see  After Visit Summary for patient specific instructions.  Future Appointments  Date Time Provider Department Center  04/23/2024  1:30 PM [facility] 1 [facility]     No orders of the defined types were placed in this encounter.      -------------------------------

## 2023-11-08 DIAGNOSIS — K080 Exfoliation of teeth due to systemic causes: Secondary | ICD-10-CM | POA: Diagnosis not present

## 2023-11-20 ENCOUNTER — Other Ambulatory Visit: Payer: Self-pay | Admitting: Psychiatry

## 2023-11-20 DIAGNOSIS — F5105 Insomnia due to other mental disorder: Secondary | ICD-10-CM

## 2023-11-20 DIAGNOSIS — F409 Phobic anxiety disorder, unspecified: Secondary | ICD-10-CM

## 2023-11-20 DIAGNOSIS — G4721 Circadian rhythm sleep disorder, delayed sleep phase type: Secondary | ICD-10-CM

## 2023-11-20 DIAGNOSIS — F411 Generalized anxiety disorder: Secondary | ICD-10-CM

## 2023-11-27 DIAGNOSIS — M1711 Unilateral primary osteoarthritis, right knee: Secondary | ICD-10-CM | POA: Diagnosis not present

## 2023-12-05 DIAGNOSIS — M1711 Unilateral primary osteoarthritis, right knee: Secondary | ICD-10-CM | POA: Diagnosis not present

## 2023-12-13 DIAGNOSIS — M1711 Unilateral primary osteoarthritis, right knee: Secondary | ICD-10-CM | POA: Diagnosis not present

## 2023-12-18 DIAGNOSIS — M1712 Unilateral primary osteoarthritis, left knee: Secondary | ICD-10-CM | POA: Diagnosis not present

## 2023-12-25 DIAGNOSIS — M1712 Unilateral primary osteoarthritis, left knee: Secondary | ICD-10-CM | POA: Diagnosis not present

## 2024-01-01 DIAGNOSIS — M1712 Unilateral primary osteoarthritis, left knee: Secondary | ICD-10-CM | POA: Diagnosis not present

## 2024-04-08 ENCOUNTER — Other Ambulatory Visit (HOSPITAL_BASED_OUTPATIENT_CLINIC_OR_DEPARTMENT_OTHER): Payer: Self-pay

## 2024-04-08 MED ORDER — COMIRNATY 30 MCG/0.3ML IM SUSY
0.3000 mL | PREFILLED_SYRINGE | Freq: Once | INTRAMUSCULAR | 0 refills | Status: AC
  Filled 2024-04-08: qty 0.3, 1d supply, fill #0

## 2024-04-23 ENCOUNTER — Other Ambulatory Visit

## 2024-05-01 ENCOUNTER — Telehealth: Payer: Self-pay | Admitting: Psychiatry

## 2024-05-01 NOTE — Telephone Encounter (Signed)
 Pt called to advise PA needs renewal for Olanzepine & Paroxetine . [facility].

## 2024-05-01 NOTE — Telephone Encounter (Signed)
 PA needed for olanzapine  and Paxil . Olanzapine  expired in August. I didn't see anything in CMM at the time I sent this msg.

## 2024-05-02 NOTE — Telephone Encounter (Signed)
 Contacted [facility] and pt filled both medications 74 days ago and no PA is needed on either medication, they are not sure why pt contacted office. She normally fills for a 90 day supply

## 2024-05-02 NOTE — Telephone Encounter (Signed)
 Faxed PA request to [facility] for both medications and their response is no PA is required per her plan. Will check if pt has a secondary insurance and contact her pharmacy for clarification.

## 2024-05-08 DIAGNOSIS — M1711 Unilateral primary osteoarthritis, right knee: Secondary | ICD-10-CM | POA: Diagnosis not present

## 2024-05-27 DIAGNOSIS — K080 Exfoliation of teeth due to systemic causes: Secondary | ICD-10-CM | POA: Diagnosis not present

## 2024-06-04 ENCOUNTER — Other Ambulatory Visit: Payer: Self-pay | Admitting: Family Medicine

## 2024-06-04 DIAGNOSIS — Z1231 Encounter for screening mammogram for malignant neoplasm of breast: Secondary | ICD-10-CM

## 2024-07-02 ENCOUNTER — Ambulatory Visit
Admission: RE | Admit: 2024-07-02 | Discharge: 2024-07-02 | Disposition: A | Discharge status: Home / Self Care | Source: Ambulatory Visit | Attending: Family Medicine | Admitting: Family Medicine

## 2024-07-02 DIAGNOSIS — Z1231 Encounter for screening mammogram for malignant neoplasm of breast: Secondary | ICD-10-CM

## 2024-07-15 ENCOUNTER — Ambulatory Visit (HOSPITAL_BASED_OUTPATIENT_CLINIC_OR_DEPARTMENT_OTHER)
Admission: RE | Admit: 2024-07-15 | Discharge: 2024-07-15 | Disposition: A | Discharge status: Home / Self Care | Source: Ambulatory Visit | Attending: Family Medicine | Admitting: Family Medicine

## 2024-07-15 DIAGNOSIS — E2839 Other primary ovarian failure: Secondary | ICD-10-CM | POA: Insufficient documentation

## 2024-09-18 ENCOUNTER — Ambulatory Visit: Admitting: Psychiatry
